# Patient Record
Sex: Female | Born: 1960 | Race: White | Hispanic: No | Marital: Married | Smoking: Never smoker
Health system: Southern US, Community
[De-identification: ages and names within clinical notes are randomized; demographics above are authoritative.]

## PROBLEM LIST (undated history)

## (undated) DIAGNOSIS — I10 Essential (primary) hypertension: Secondary | ICD-10-CM

## (undated) DIAGNOSIS — E785 Hyperlipidemia, unspecified: Secondary | ICD-10-CM

## (undated) DIAGNOSIS — J45909 Unspecified asthma, uncomplicated: Secondary | ICD-10-CM

## (undated) DIAGNOSIS — I2510 Atherosclerotic heart disease of native coronary artery without angina pectoris: Secondary | ICD-10-CM

## (undated) DIAGNOSIS — I214 Non-ST elevation (NSTEMI) myocardial infarction: Secondary | ICD-10-CM

## (undated) HISTORY — DX: Unspecified asthma, uncomplicated: J45.909

## (undated) HISTORY — DX: Hyperlipidemia, unspecified: E78.5

## (undated) HISTORY — DX: Atherosclerotic heart disease of native coronary artery without angina pectoris: I25.10

---
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
## 2005-09-03 ENCOUNTER — Emergency Department: Payer: Self-pay | Admitting: Unknown Physician Specialty

## 2006-09-27 ENCOUNTER — Ambulatory Visit: Payer: Self-pay | Admitting: Obstetrics and Gynecology

## 2006-10-04 ENCOUNTER — Ambulatory Visit: Payer: Self-pay | Admitting: Obstetrics and Gynecology

## 2007-05-08 ENCOUNTER — Ambulatory Visit: Payer: Self-pay | Admitting: General Surgery

## 2007-08-31 ENCOUNTER — Other Ambulatory Visit: Payer: Self-pay

## 2007-08-31 ENCOUNTER — Emergency Department: Payer: Self-pay | Admitting: Emergency Medicine

## 2007-11-04 ENCOUNTER — Ambulatory Visit: Payer: Self-pay | Admitting: General Surgery

## 2007-11-22 ENCOUNTER — Other Ambulatory Visit: Payer: Self-pay

## 2007-11-22 ENCOUNTER — Emergency Department: Payer: Medicare Other | Admitting: Emergency Medicine
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
## 2009-01-06 ENCOUNTER — Ambulatory Visit: Payer: Self-pay | Admitting: Obstetrics and Gynecology

## 2012-07-04 ENCOUNTER — Emergency Department: Payer: Self-pay | Admitting: Emergency Medicine

## 2012-11-10 ENCOUNTER — Emergency Department: Payer: Self-pay | Admitting: Emergency Medicine

## 2013-01-23 ENCOUNTER — Emergency Department: Payer: Self-pay | Admitting: Emergency Medicine
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
## 2017-06-17 DIAGNOSIS — G4733 Obstructive sleep apnea (adult) (pediatric): Secondary | ICD-10-CM | POA: Diagnosis present

## 2017-06-19 DIAGNOSIS — F419 Anxiety disorder, unspecified: Secondary | ICD-10-CM | POA: Insufficient documentation

## 2018-12-25 DIAGNOSIS — I214 Non-ST elevation (NSTEMI) myocardial infarction: Secondary | ICD-10-CM

## 2018-12-25 HISTORY — PX: CARDIAC CATHETERIZATION: SHX172

## 2018-12-25 HISTORY — DX: Non-ST elevation (NSTEMI) myocardial infarction: I21.4

## 2019-01-14 DIAGNOSIS — I252 Old myocardial infarction: Secondary | ICD-10-CM | POA: Insufficient documentation

## 2019-07-21 ENCOUNTER — Other Ambulatory Visit: Payer: Self-pay

## 2019-07-21 ENCOUNTER — Emergency Department
Admission: EM | Admit: 2019-07-21 | Discharge: 2019-07-21 | Disposition: A | Discharge status: Home / Self Care | Payer: Medicare Other | Attending: Emergency Medicine | Admitting: Emergency Medicine

## 2019-07-21 ENCOUNTER — Encounter: Payer: Self-pay | Admitting: *Deleted

## 2019-07-21 DIAGNOSIS — W540XXA Bitten by dog, initial encounter: Secondary | ICD-10-CM | POA: Diagnosis not present

## 2019-07-21 DIAGNOSIS — Y999 Unspecified external cause status: Secondary | ICD-10-CM | POA: Insufficient documentation

## 2019-07-21 DIAGNOSIS — I10 Essential (primary) hypertension: Secondary | ICD-10-CM | POA: Diagnosis not present

## 2019-07-21 DIAGNOSIS — S4991XA Unspecified injury of right shoulder and upper arm, initial encounter: Secondary | ICD-10-CM | POA: Diagnosis present

## 2019-07-21 DIAGNOSIS — Y929 Unspecified place or not applicable: Secondary | ICD-10-CM | POA: Insufficient documentation

## 2019-07-21 DIAGNOSIS — Y93K1 Activity, walking an animal: Secondary | ICD-10-CM | POA: Insufficient documentation

## 2019-07-21 DIAGNOSIS — S40811A Abrasion of right upper arm, initial encounter: Secondary | ICD-10-CM | POA: Diagnosis not present

## 2019-07-21 DIAGNOSIS — I252 Old myocardial infarction: Secondary | ICD-10-CM | POA: Insufficient documentation

## 2019-07-21 DIAGNOSIS — Z23 Encounter for immunization: Secondary | ICD-10-CM | POA: Insufficient documentation

## 2019-07-21 HISTORY — DX: Essential (primary) hypertension: I10

## 2019-07-21 MED ORDER — CYCLOBENZAPRINE HCL 5 MG PO TABS: 5.0000 mg | ORAL_TABLET | Freq: Three times a day (TID) | ORAL | 0 refills | Status: AC | PRN

## 2019-07-21 MED ORDER — TETANUS-DIPHTH-ACELL PERTUSSIS 5-2.5-18.5 LF-MCG/0.5 IM SUSP
0.5000 mL | Freq: Once | INTRAMUSCULAR | Status: AC
Start: 2019-07-21 — End: 2019-07-21
  Administered 2019-07-21: 0.5 mL via INTRAMUSCULAR
  Filled 2019-07-21: qty 0.5

## 2019-07-21 MED ORDER — CLINDAMYCIN HCL 150 MG PO CAPS
300.0000 mg | ORAL_CAPSULE | Freq: Once | ORAL | Status: AC
Start: 2019-07-21 — End: 2019-07-21
  Administered 2019-07-21: 22:00:00 300 mg via ORAL
  Filled 2019-07-21: qty 2

## 2019-07-21 MED ORDER — CLINDAMYCIN HCL 300 MG PO CAPS: 300.0000 mg | ORAL_CAPSULE | Freq: Three times a day (TID) | ORAL | 0 refills | Status: AC

## 2019-07-21 MED ORDER — DOXYCYCLINE HYCLATE 100 MG PO TABS: 100.0000 mg | ORAL_TABLET | Freq: Two times a day (BID) | ORAL | 0 refills | Status: DC

## 2019-07-21 MED ORDER — DOXYCYCLINE HYCLATE 100 MG PO TABS
100.0000 mg | ORAL_TABLET | Freq: Once | ORAL | Status: AC
  Administered 2019-07-21: 22:00:00 100 mg via ORAL
  Filled 2019-07-21: qty 1

## 2019-07-21 NOTE — ED Triage Notes (Signed)
Pt to ED after being bitten by a dog this afternoon on her right arm. Skin is not broken at this time but significant bruising noted. PT does not know if the dog was up to date on shots but has already reported the bite.

## 2019-07-21 NOTE — Discharge Instructions (Signed)
You are being treated for a dog bite (superficial) to the upper arm. Take the antibiotics as directed. Keep the area clean, dry, and covered. Follow-up with your provider for interim wound check.

## 2019-07-22 ENCOUNTER — Encounter: Payer: Self-pay | Admitting: Physician Assistant

## 2019-07-22 NOTE — ED Provider Notes (Signed)
[facility] Emergency Department Provider Note ____________________________________________  Time seen: 2115  I have reviewed the triage vital signs and the nursing notes.  HISTORY  Chief Complaint  Animal Bite  HPI Donna Prince is a 58 y.o. female presents to the ED for evaluation after a dog bite.  Patient describes she was walking her small puppy, when a large pitbull that she did not recognize that was not on the leash, began to attack her dog.  She states that when she noted that the larger dog was about to attack her puppies belly, she intervened.  She sustained a bite wound to the right upper arm musculature.  The patient noted immediate pain and deep muscle soreness to the region.  It was not until she got home home, that she finally evaluated her self, and found she had deep bruising to the upper arm and tricep muscle region.  There is superficial abrasion to the skin, but no indication of any deep puncture or bite wounds.  Patient presents now for further evaluation of injuries but she reports her tetanus is not up-to-date.  She does also note an allergy to penicillin drugs.  Patient is not aware of the larger dog's vaccine status, but the bite has been reported to local Production assistant, radioanimal control officers.  Past Medical History:  Diagnosis Date  . Hypertension   . MI, acute, non ST segment elevation (HCC) 2020    There are no active problems to display for this patient.   History reviewed. No pertinent surgical history.  Prior to Admission medications   Medication Sig Start Date End Date Taking? Authorizing Provider  clindamycin (CLEOCIN) 300 MG capsule Take 1 capsule (300 mg total) by mouth 3 (three) times daily for 10 days. 07/21/19 07/31/19  Kutter Schnepf, Charlesetta IvoryJenise V Bacon, PA-C  cyclobenzaprine (FLEXERIL) 5 MG tablet Take 1 tablet (5 mg total) by mouth 3 (three) times daily as needed for up to 3 days. 07/21/19 07/24/19  Kwynn Schlotter, Charlesetta IvoryJenise V Bacon, PA-C  doxycycline  (VIBRA-TABS) 100 MG tablet Take 1 tablet (100 mg total) by mouth 2 (two) times daily. 07/21/19   Ayla Dunigan, Charlesetta IvoryJenise V Bacon, PA-C    Allergies Penicillins  History reviewed. No pertinent family history.  Social History Social History   Tobacco Use  . Smoking status: Never Smoker  . Smokeless tobacco: Never Used  Substance Use Topics  . Alcohol use: Never    Frequency: Never  . Drug use: Never    Review of Systems  Constitutional: Negative for fever. Eyes: Negative for visual changes. ENT: Negative for sore throat. Cardiovascular: Negative for chest pain. Respiratory: Negative for shortness of breath. Gastrointestinal: Negative for abdominal pain, vomiting and diarrhea. Genitourinary: Negative for dysuria. Musculoskeletal: Negative for back pain.  Right upper arm muscle pain secondary to dog bite Skin: Negative for rash.  Neurological: Negative for headaches, focal weakness or numbness. ____________________________________________  PHYSICAL EXAM:  VITAL SIGNS: ED Triage Vitals  Enc Vitals Group     BP 07/21/19 1959 (!) 186/93     Pulse Rate 07/21/19 1959 85     Resp 07/21/19 1959 16     Temp 07/21/19 1959 98.2 F (36.8 C)     Temp Source 07/21/19 1959 Oral     SpO2 07/21/19 1959 99 %     Weight 07/21/19 2000 216 lb (98 kg)     Height 07/21/19 2000 5\' 6"  (1.676 m)     Head Circumference --      Peak Flow --  Pain Score 07/21/19 1959 5     Pain Loc --      Pain Edu? --      Excl. in [place]? --     Constitutional: Alert and oriented. Well appearing and in no distress. Head: Normocephalic and atraumatic. Eyes: Conjunctivae are normal. Normal extraocular movements Cardiovascular: Normal rate, regular rhythm. Normal distal pulses. Respiratory: Normal respiratory effort.  Musculoskeletal: Right arm without any obvious deformity or dislocation.  Patient is noted to have a large area of ecchymosis and bruising to the tricep muscle region.  The areas also shows a  superficial abrasion but no obvious laceration, puncture, or bite wounds.  Patient likely was spared by her overlying clothing.  Normal composite fist distally.  Nontender with normal range of motion in all extremities.  Neurologic: Cranial nerves II through XII grossly intact.  Normal gait without ataxia. Normal speech and language. No gross focal neurologic deficits are appreciated. Skin:  Skin is warm, dry and intact. No rash noted. Psychiatric: Mood and affect are normal. Patient exhibits appropriate insight and judgment. ____________________________________________  PROCEDURES  Procedures Tdap 0.5 ml IM Doxycycline 100 mg PO Clindamycin 300 mg PO ____________________________________________  INITIAL IMPRESSION / ASSESSMENT AND PLAN / ED COURSE  [name] was evaluated in Emergency Department on 07/22/2019 for the symptoms described in the history of present illness. She was evaluated in the context of the global COVID-19 pandemic, which necessitated consideration that the patient might be at risk for infection with the SARS-CoV-2 virus that causes COVID-19. Institutional protocols and algorithms that pertain to the evaluation of patients at risk for COVID-19 are in a state of rapid change based on information released by regulatory bodies including the CDC and federal and state organizations. These policies and algorithms were followed during the patient's care in the ED.  Patient with ED evaluation of injury sustained following a dog bite attack.  Patient sustained a bite to the right posterior arm over the tricep musculature.  There is superficial abrasion, but no deep tissue injury, laceration, or puncture wounds.  Patient is treated empirically with antibiotics as she has an overlying abrasion to the area which may have been caused by teeth contacting the skin.  Patient's tetanus was updated and she is encouraged to take the medications as prescribed.  She will follow-up with an  control related to vaccine/quarantine status of the dog.  Patient verbalizes understanding of her superficial wound care management instructions.  She will follow-up with her primary provider or return to the ED as discussed. ____________________________________________  FINAL CLINICAL IMPRESSION(S) / ED DIAGNOSES  Final diagnoses:  Dog bite, initial encounter      Melvenia Needles, PA-C 07/22/19 2337    Nance Pear, MD 07/23/19 [phone]

## 2022-10-20 ENCOUNTER — Inpatient Hospital Stay
Admission: EM | Admit: 2022-10-20 | Discharge: 2022-10-23 | DRG: 287 | Disposition: A | Discharge status: Home / Self Care | Payer: Self-pay | Attending: Internal Medicine | Admitting: Internal Medicine

## 2022-10-20 ENCOUNTER — Encounter: Payer: Self-pay | Admitting: Intensive Care

## 2022-10-20 ENCOUNTER — Other Ambulatory Visit: Payer: Self-pay

## 2022-10-20 ENCOUNTER — Emergency Department: Payer: 59

## 2022-10-20 DIAGNOSIS — R932 Abnormal findings on diagnostic imaging of liver and biliary tract: Secondary | ICD-10-CM

## 2022-10-20 DIAGNOSIS — Z888 Allergy status to other drugs, medicaments and biological substances status: Secondary | ICD-10-CM

## 2022-10-20 DIAGNOSIS — E0590 Thyrotoxicosis, unspecified without thyrotoxic crisis or storm: Secondary | ICD-10-CM | POA: Diagnosis present

## 2022-10-20 DIAGNOSIS — I2510 Atherosclerotic heart disease of native coronary artery without angina pectoris: Secondary | ICD-10-CM | POA: Diagnosis present

## 2022-10-20 DIAGNOSIS — G4733 Obstructive sleep apnea (adult) (pediatric): Secondary | ICD-10-CM | POA: Diagnosis present

## 2022-10-20 DIAGNOSIS — R079 Chest pain, unspecified: Secondary | ICD-10-CM

## 2022-10-20 DIAGNOSIS — I214 Non-ST elevation (NSTEMI) myocardial infarction: Secondary | ICD-10-CM

## 2022-10-20 DIAGNOSIS — F4310 Post-traumatic stress disorder, unspecified: Secondary | ICD-10-CM | POA: Diagnosis present

## 2022-10-20 DIAGNOSIS — I2489 Other forms of acute ischemic heart disease: Secondary | ICD-10-CM | POA: Diagnosis present

## 2022-10-20 DIAGNOSIS — Z91148 Patient's other noncompliance with medication regimen for other reason: Secondary | ICD-10-CM

## 2022-10-20 DIAGNOSIS — I161 Hypertensive emergency: Secondary | ICD-10-CM | POA: Diagnosis not present

## 2022-10-20 DIAGNOSIS — I252 Old myocardial infarction: Secondary | ICD-10-CM

## 2022-10-20 DIAGNOSIS — Z955 Presence of coronary angioplasty implant and graft: Secondary | ICD-10-CM

## 2022-10-20 DIAGNOSIS — R7989 Other specified abnormal findings of blood chemistry: Secondary | ICD-10-CM | POA: Diagnosis present

## 2022-10-20 DIAGNOSIS — E785 Hyperlipidemia, unspecified: Secondary | ICD-10-CM | POA: Diagnosis present

## 2022-10-20 DIAGNOSIS — Z6830 Body mass index (BMI) 30.0-30.9, adult: Secondary | ICD-10-CM

## 2022-10-20 DIAGNOSIS — E041 Nontoxic single thyroid nodule: Principal | ICD-10-CM

## 2022-10-20 DIAGNOSIS — Z880 Allergy status to penicillin: Secondary | ICD-10-CM

## 2022-10-20 DIAGNOSIS — E663 Overweight: Secondary | ICD-10-CM | POA: Diagnosis present

## 2022-10-20 DIAGNOSIS — Z79899 Other long term (current) drug therapy: Secondary | ICD-10-CM

## 2022-10-20 DIAGNOSIS — I110 Hypertensive heart disease with heart failure: Secondary | ICD-10-CM | POA: Diagnosis present

## 2022-10-20 DIAGNOSIS — I5032 Chronic diastolic (congestive) heart failure: Secondary | ICD-10-CM | POA: Diagnosis present

## 2022-10-20 DIAGNOSIS — Z91199 Patient's noncompliance with other medical treatment and regimen due to unspecified reason: Secondary | ICD-10-CM

## 2022-10-20 LAB — BASIC METABOLIC PANEL
Anion gap: 7 (ref 5–15)
BUN: 18 mg/dL (ref 6–20)
CO2: 25 mmol/L (ref 22–32)
Calcium: 9.2 mg/dL (ref 8.9–10.3)
Chloride: 108 mmol/L (ref 98–111)
Creatinine, Ser: 1.06 mg/dL — ABNORMAL HIGH (ref 0.44–1.00)
GFR, Estimated: >60 mL/min (ref >60)
Glucose, Bld: 163 mg/dL — ABNORMAL HIGH (ref 70–99)
Potassium: 3.9 mmol/L (ref 3.5–5.1)
Sodium: 140 mmol/L (ref 135–145)

## 2022-10-20 LAB — TROPONIN I (HIGH SENSITIVITY)
Troponin I (High Sensitivity): 31 ng/L — ABNORMAL HIGH (ref <18)
Troponin I (High Sensitivity): 574 ng/L (ref <18)

## 2022-10-20 LAB — CBC
HCT: 38.3 % (ref 36.0–46.0)
Hemoglobin: 12.9 g/dL (ref 12.0–15.0)
MCH: 26.7 pg (ref 26.0–34.0)
MCHC: 33.7 g/dL (ref 30.0–36.0)
MCV: 79.1 fL — ABNORMAL LOW (ref 80.0–100.0)
Platelets: 152 10*3/uL (ref 150–400)
RBC: 4.84 MIL/uL (ref 3.87–5.11)
RDW: 13.2 % (ref 11.5–15.5)
WBC: 3.6 10*3/uL — ABNORMAL LOW (ref 4.0–10.5)
nRBC: 0 % (ref 0.0–0.2)

## 2022-10-20 MED ORDER — MORPHINE SULFATE (PF) 2 MG/ML IV SOLN: 2.0000 mg | INTRAVENOUS | Status: DC | PRN

## 2022-10-20 MED ORDER — ATORVASTATIN CALCIUM 20 MG PO TABS
40.0000 mg | ORAL_TABLET | Freq: Every day | ORAL | Status: DC
  Administered 2022-10-21 – 2022-10-22 (×2): 40 mg via ORAL
  Filled 2022-10-20 (×2): qty 2

## 2022-10-20 MED ORDER — ONDANSETRON HCL 4 MG/2ML IJ SOLN: 4.0000 mg | Freq: Four times a day (QID) | INTRAMUSCULAR | Status: DC | PRN

## 2022-10-20 MED ORDER — NITROGLYCERIN 0.4 MG SL SUBL: 0.4000 mg | SUBLINGUAL_TABLET | SUBLINGUAL | Status: DC | PRN

## 2022-10-20 MED ORDER — LABETALOL HCL 5 MG/ML IV SOLN
10.0000 mg | Freq: Once | INTRAVENOUS | Status: AC
  Administered 2022-10-20: 10 mg via INTRAVENOUS
  Filled 2022-10-20: qty 4

## 2022-10-20 MED ORDER — METOPROLOL TARTRATE 25 MG PO TABS
12.5000 mg | ORAL_TABLET | Freq: Two times a day (BID) | ORAL | Status: DC
  Administered 2022-10-21 (×2): 12.5 mg via ORAL
  Filled 2022-10-20 (×2): qty 1

## 2022-10-20 MED ORDER — ACETAMINOPHEN 325 MG RE SUPP: 650.0000 mg | Freq: Four times a day (QID) | RECTAL | Status: DC | PRN

## 2022-10-20 MED ORDER — ACETAMINOPHEN 325 MG PO TABS: 650.0000 mg | ORAL_TABLET | Freq: Four times a day (QID) | ORAL | Status: DC | PRN

## 2022-10-20 MED ORDER — ACETAMINOPHEN 325 MG PO TABS
650.0000 mg | ORAL_TABLET | ORAL | Status: DC | PRN
  Administered 2022-10-22: 650 mg via ORAL
  Filled 2022-10-20: qty 2

## 2022-10-20 MED ORDER — ENOXAPARIN SODIUM 40 MG/0.4ML IJ SOSY
40.0000 mg | PREFILLED_SYRINGE | INTRAMUSCULAR | Status: DC
  Administered 2022-10-21: 40 mg via SUBCUTANEOUS
  Filled 2022-10-20: qty 0.4

## 2022-10-20 MED ORDER — AMLODIPINE BESYLATE 5 MG PO TABS
5.0000 mg | ORAL_TABLET | Freq: Every day | ORAL | Status: DC
  Administered 2022-10-21 – 2022-10-23 (×4): 5 mg via ORAL
  Filled 2022-10-20 (×4): qty 1

## 2022-10-20 MED ORDER — ALPRAZOLAM 0.25 MG PO TABS
0.2500 mg | ORAL_TABLET | Freq: Two times a day (BID) | ORAL | Status: DC | PRN
  Administered 2022-10-21 – 2022-10-23 (×2): 0.25 mg via ORAL
  Filled 2022-10-20 (×2): qty 1

## 2022-10-20 MED ORDER — ONDANSETRON HCL 4 MG PO TABS: 4.0000 mg | ORAL_TABLET | Freq: Four times a day (QID) | ORAL | Status: DC | PRN

## 2022-10-20 MED ORDER — ASPIRIN 81 MG PO TBEC
81.0000 mg | DELAYED_RELEASE_TABLET | Freq: Every day | ORAL | Status: DC
  Administered 2022-10-21 – 2022-10-23 (×3): 81 mg via ORAL
  Filled 2022-10-20 (×3): qty 1

## 2022-10-20 MED ORDER — ASPIRIN 81 MG PO CHEW
324.0000 mg | CHEWABLE_TABLET | Freq: Once | ORAL | Status: AC
  Administered 2022-10-20: 324 mg via ORAL
  Filled 2022-10-20: qty 4

## 2022-10-20 MED ORDER — HYDROCODONE-ACETAMINOPHEN 5-325 MG PO TABS
1.0000 | ORAL_TABLET | ORAL | Status: DC | PRN
  Administered 2022-10-21 (×2): 2 via ORAL
  Filled 2022-10-20 (×2): qty 2

## 2022-10-20 MED ORDER — IOHEXOL 350 MG/ML SOLN
100.0000 mL | Freq: Once | INTRAVENOUS | Status: AC | PRN
  Administered 2022-10-20: 100 mL via INTRAVENOUS

## 2022-10-20 MED ORDER — ALPRAZOLAM 0.5 MG PO TABS
0.2500 mg | ORAL_TABLET | Freq: Once | ORAL | Status: AC
  Administered 2022-10-20: 0.25 mg via ORAL
  Filled 2022-10-20: qty 1

## 2022-10-20 MED ORDER — HYDRALAZINE HCL 20 MG/ML IJ SOLN: 10.0000 mg | INTRAMUSCULAR | Status: DC | PRN

## 2022-10-20 NOTE — ED Triage Notes (Addendum)
Patient c/o left chest pressure with sob that started today. Patient appears anxious. Reports she is suppose to take blood thinners and hypertension medicine but quit about 6 months ago

## 2022-10-20 NOTE — H&P (Incomplete)
History and Physical    Patient: Donna Prince:[id number] DOB: 06-12-1961 DOA: 10/20/2022 DOS: the patient was seen and examined on 10/20/2022 PCP: Tanna Furry, MD  Patient coming from: Home  Chief Complaint:  Chief Complaint  Patient presents with   Chest Pain    HPI: Donna Prince is a 61 y.o. 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  She admits to not taking her meds in several months and has been lost to follow-up.  She denies vomiting, diaphoresis, palpitations or lightheadedness.  Denies lower extremity pain or swelling.  Denies fever or chills.  Patient took 2 aspirin at home and was chest pain-free by arrival. ED course and dataReview: On arrival BP 181/107 with pulse 107 and respirations 22 with O2 sat 97% on room air and afebrile.  First troponin 574 with labs otherwise unremarkable.  EKG, personally viewed and interpreted showing sinus tachycardia at 108 with nonspecific ST-T wave changes.  CTA chest shows no evidence of PE or other acute intrathoracic process. Patient treated with chewable aspirin and given a dose of labetalol 10 mg with improvement in blood pressure to 159/88 admission. The ED provider spoke with cardiologist Dr Azucena Cecil who advised to treat as demand ischemia unless troponin significantly uptrending.  Hospitalist consulted for admission.   Review of Systems: As mentioned in the history of present illness. All other systems reviewed and are negative.  Past Medical History:  Diagnosis Date   Hypertension    MI, acute, non ST segment elevation (HCC) 2020   History reviewed. No pertinent surgical history. Social History:  reports that she has never smoked. She has never used smokeless tobacco. She  reports that she does not drink alcohol and does not use drugs.  Allergies  Allergen Reactions   Penicillins Hives    History reviewed. No pertinent family history.  Prior to Admission medications   Medication Sig Start Date End Date Taking? Authorizing Provider  doxycycline (VIBRA-TABS) 100 MG tablet Take 1 tablet (100 mg total) by mouth 2 (two) times daily. 07/21/19   Menshew, Charlesetta Ivory, PA-C    Physical Exam: Vitals:   10/20/22 2025 10/20/22 2040 10/20/22 2100 10/20/22 2117  BP: (!) 193/110 (!) 204/101 (!) 170/86 (!) 159/88  Pulse: 99 99 90 87  Resp:  18 20 (!) 21  Temp:      TempSrc:      SpO2: 98% 97% 98% 98%  Weight:      Height:       Physical Exam  Labs on Admission: I have personally reviewed following labs and imaging studies  CBC: Recent Labs  Lab 10/20/22 1807  WBC 3.6*  HGB 12.9  HCT 38.3  MCV 79.1*  PLT 152   Basic Metabolic Panel: Recent Labs  Lab 10/20/22 1807  NA 140  K 3.9  CL 108  CO2 25  GLUCOSE 163*  BUN 18  CREATININE 1.06*  CALCIUM 9.2   GFR: Estimated Creatinine Clearance: 61.5 mL/min (A) (by C-G formula based on SCr of 1.06 mg/dL (H)). Liver Function Tests: No results for input(s): "AST", "ALT", "ALKPHOS", "BILITOT", "PROT", "ALBUMIN" in the last 168 hours. No results for input(s): "LIPASE", "AMYLASE" in the last 168 hours. No results for input(s): "AMMONIA" in the  last 168 hours. Coagulation Profile: No results for input(s): "INR", "PROTIME" in the last 168 hours. Cardiac Enzymes: No results for input(s): "CKTOTAL", "CKMB", "CKMBINDEX", "TROPONINI" in the last 168 hours. BNP (last 3 results) No results for input(s): "PROBNP" in the last 8760 hours. HbA1C: No results for input(s): "HGBA1C" in the last 72 hours. CBG: No results for input(s): "GLUCAP" in the last 168 hours. Lipid Profile: No results for input(s): "CHOL", "HDL", "LDLCALC", "TRIG", "CHOLHDL", "LDLDIRECT" in the last 72 hours. Thyroid Function Tests: No  results for input(s): "TSH", "T4TOTAL", "FREET4", "T3FREE", "THYROIDAB" in the last 72 hours. Anemia Panel: No results for input(s): "VITAMINB12", "FOLATE", "FERRITIN", "TIBC", "IRON", "RETICCTPCT" in the last 72 hours. Urine analysis: No results found for: "COLORURINE", "APPEARANCEUR", "LABSPEC", "PHURINE", "GLUCOSEU", "HGBUR", "BILIRUBINUR", "KETONESUR", "PROTEINUR", "UROBILINOGEN", "NITRITE", "LEUKOCYTESUR"  Radiological Exams on Admission: CT Angio Chest Aorta W and/or Wo Contrast  Result Date: 10/20/2022 CLINICAL DATA:  Left chest pressure and shortness of breath. EXAM: CT ANGIOGRAPHY CHEST WITH CONTRAST TECHNIQUE: Multidetector CT imaging of the chest was performed using the standard protocol during bolus administration of intravenous contrast. Multiplanar CT image reconstructions and MIPs were obtained to evaluate the vascular anatomy. RADIATION DOSE REDUCTION: This exam was performed according to the departmental dose-optimization program which includes automated exposure control, adjustment of the mA and/or kV according to patient size and/or use of iterative reconstruction technique. CONTRAST:  OMNIPAQUE IOHEXOL 350 MG/ML SOLN COMPARISON:  November 14, 2007 FINDINGS: Cardiovascular: Satisfactory opacification of the pulmonary arteries to the segmental level. No evidence of pulmonary embolism. Normal heart size. No pericardial effusion. Mediastinum/Nodes: No enlarged mediastinal, hilar, or axillary lymph nodes. Small bilateral thyroid nodules are noted. The trachea and esophagus demonstrate no significant findings. Lungs/Pleura: Lungs are clear. No pleural effusion or pneumothorax. Upper Abdomen: A 13 mm diameter round hypodense lesion is seen within the anterior aspect of the right lobe of the liver. Additional subcentimeter foci of low attenuation are noted within the left lobe of the thyroid gland. Musculoskeletal: No chest wall abnormality. No acute or significant osseous findings.  Review of the MIP images confirms the above findings. IMPRESSION: 1. No evidence of pulmonary embolism or other acute intrathoracic process. 2. Small bilateral thyroid nodules. No follow-up imaging is recommended. This follows ACR consensus guidelines: Managing Incidental Thyroid Nodules Detected on Imaging: White Paper of the ACR Incidental Thyroid Findings Committee. J Am Coll Radiol 2015; 12:143-150. 3. Findings which may represent small hepatic cysts and/or hemangiomas. Correlation with nonemergent hepatic ultrasound is recommended. Electronically Signed   By: Aram Candela M.D.   On: 10/20/2022 21:06   DG Chest 2 View  Result Date: 10/20/2022 CLINICAL DATA:  Left chest pressure and shortness of breath starting today EXAM: CHEST - 2 VIEW COMPARISON:  11/11/2012 FINDINGS: The heart size and mediastinal contours are within normal limits. Both lungs are clear. The visualized skeletal structures are unremarkable. IMPRESSION: No active cardiopulmonary disease. Electronically Signed   By: Gaylyn Rong M.D.   On: 10/20/2022 18:29     Data Reviewed: Relevant notes from primary care and specialist visits, past discharge summaries as available in EHR, including Care Everywhere. Prior diagnostic testing as pertinent to current admission diagnoses Updated medications and problem lists for reconciliation ED course, including vitals, labs, imaging, treatment and response to treatment Triage notes, nursing and pharmacy notes and ED provider's notes Notable results as noted in HPI   Assessment and Plan: * Hypertensive emergency Patient noncompliant with BP meds with BP 181/107 on arrival and symptomatic  for chest pain and shortness of breath Improved with labetalol 10 mg x 1 dose in the ED Restart prior antihypertensives  Lopressor 12.5 mg twice daily.   Will hold prior losartan as patient reports possible mouth swelling(will update allergy list) Amlodipine 5mg  daily TOC consult to explore  barriers to medication compliance    NSTEMI (non-ST elevated myocardial infarction) ([place]) CAD with history of PCI 2020 and 2021 for NSTEMI Patient presents with chest pain and shortness of breath.  First troponin 574 but EKG nonacute Continue to trend troponins Suspecting demand ischemia from hypertensive emergency .The ED provider spoke with cardiologist Dr Garen Lah who advised to treat as demand ischemia unless significant delta with trend Continue atorvastatin 80 mg daily, aspirin 81 mg daily, nitroglycerin sublingual as needed chest pain Will get echo Cardiology consult to follow    Noncompliance, with medication, CPAP and follow-up Counseling provided regarding risk to overall prognosis TOC consult  Obstructive sleep apnea Not currently on CPAP    DVT prophylaxis: Lovenox  Consults: [facility] cardiology, Dr. Garen Lah  Advance Care Planning: full code  Family Communication: none  Disposition Plan: Back to previous home environment  Severity of Illness: The appropriate patient status for this patient is OBSERVATION. Observation status is judged to be reasonable and necessary in order to provide the required intensity of service to ensure the patient's safety. The patient's presenting symptoms, physical exam findings, and initial radiographic and laboratory data in the context of their medical condition is felt to place them at decreased risk for further clinical deterioration. Furthermore, it is anticipated that the patient will be medically stable for discharge from the hospital within 2 midnights of admission.   Author: Athena Masse, MD 10/20/2022 9:57 PM  For on call review [email or web address].

## 2022-10-20 NOTE — ED Provider Notes (Signed)
[facility] Provider Note    Event Date/Time   First MD Initiated Contact with Patient 10/20/22 2021     (approximate)   History   Chest Pain   HPI  Donna Prince is a 61 y.o. female reports a history of coronary artery disease with previous stenting at [facility], hypertension  Patient reports that today while shopping she suddenly felt a sharp discomfort and a burning feeling in her left upper chest.  It was fairly severe and persisted for a while but has since gone away after she arrived to the ED waiting room.  Her symptoms have now all gone away completely.  There was no shortness of breath.  No leg swelling.  She reports that the pain was sort of sharp rather abrupt in onset located in the left upper chest and had a burning component also across her chest with it.  It is since gone away.  She took 2 baby aspirin at home  She felt anxious when it first started felt as though she was having a bout to have a bout of "PTSD" or some type of panic attack but that symptom has improved now.  She used to take blood pressure medicine but stopped taking it, she reports one of the medicines possibly the 1 that was 100 mg at [facility] was giving her caused a bit of a swelling feeling in her throat at times but she is not really sure.  She has self discontinued it  No ongoing symptoms.  Denies any risk of pregnancy.  No headache.  No abdominal pain     Physical Exam   Triage Vital Signs: ED Triage Vitals  Enc Vitals Group     BP 10/20/22 1807 (!) 181/107     Pulse Rate 10/20/22 1807 (!) 107     Resp 10/20/22 1807 (!) 22     Temp 10/20/22 1807 98.1 F (36.7 C)     Temp Source 10/20/22 1807 Oral     SpO2 10/20/22 1807 97 %     Weight 10/20/22 1804 180 lb (81.6 kg)     Height 10/20/22 1804 5\' 6"  (1.676 m)     Head Circumference --      Peak Flow --      Pain Score 10/20/22 1804 5     Pain Loc --      Pain Edu? --      Excl. in GC? --     Most recent vital  signs: Vitals:   10/20/22 2230 10/20/22 2245  BP: (!) 150/83 (!) 153/74  Pulse: 90 80  Resp: 17 12  Temp:    SpO2: 98% 97%     General: Awake, no distress.  Appears slightly anxious. CV:  Good peripheral perfusion.  Normal tones Resp:  Normal effort.  Clear bilateral Abd:  No distention.  Soft nontender Other:  No lower extremity edema venous cords or congestion   ED Results / Procedures / Treatments   Labs (all labs ordered are listed, but only abnormal results are displayed) Labs Reviewed  BASIC METABOLIC PANEL - Abnormal; Notable for the following components:      Result Value   Glucose, Bld 163 (*)    Creatinine, Ser 1.06 (*)    All other components within normal limits  CBC - Abnormal; Notable for the following components:   WBC 3.6 (*)    MCV 79.1 (*)    All other components within normal limits  TROPONIN I (HIGH  SENSITIVITY) - Abnormal; Notable for the following components:   Troponin I (High Sensitivity) 31 (*)    All other components within normal limits  TROPONIN I (HIGH SENSITIVITY) - Abnormal; Notable for the following components:   Troponin I (High Sensitivity) 574 (*)    All other components within normal limits  HIV ANTIBODY (ROUTINE TESTING W REFLEX)  LIPOPROTEIN A (LPA)  CBC  CREATININE, SERUM     EKG  Interpreted by me at 1810 heart rate 110 QRS 80 QTc 460 Sinus tachycardia.  No evidence of acute ischemia   RADIOLOGY  CT Angio Chest Aorta W and/or Wo Contrast  Result Date: 10/20/2022 CLINICAL DATA:  Left chest pressure and shortness of breath. EXAM: CT ANGIOGRAPHY CHEST WITH CONTRAST TECHNIQUE: Multidetector CT imaging of the chest was performed using the standard protocol during bolus administration of intravenous contrast. Multiplanar CT image reconstructions and MIPs were obtained to evaluate the vascular anatomy. RADIATION DOSE REDUCTION: This exam was performed according to the departmental dose-optimization program which includes  automated exposure control, adjustment of the mA and/or kV according to patient size and/or use of iterative reconstruction technique. CONTRAST:  OMNIPAQUE IOHEXOL 350 MG/ML SOLN COMPARISON:  November 14, 2007 FINDINGS: Cardiovascular: Satisfactory opacification of the pulmonary arteries to the segmental level. No evidence of pulmonary embolism. Normal heart size. No pericardial effusion. Mediastinum/Nodes: No enlarged mediastinal, hilar, or axillary lymph nodes. Small bilateral thyroid nodules are noted. The trachea and esophagus demonstrate no significant findings. Lungs/Pleura: Lungs are clear. No pleural effusion or pneumothorax. Upper Abdomen: A 13 mm diameter round hypodense lesion is seen within the anterior aspect of the right lobe of the liver. Additional subcentimeter foci of low attenuation are noted within the left lobe of the thyroid gland. Musculoskeletal: No chest wall abnormality. No acute or significant osseous findings. Review of the MIP images confirms the above findings. IMPRESSION: 1. No evidence of pulmonary embolism or other acute intrathoracic process. 2. Small bilateral thyroid nodules. No follow-up imaging is recommended. This follows ACR consensus guidelines: Managing Incidental Thyroid Nodules Detected on Imaging: White Paper of the ACR Incidental Thyroid Findings Committee. J Am Coll Radiol 2015; 12:143-150. 3. Findings which may represent small hepatic cysts and/or hemangiomas. Correlation with nonemergent hepatic ultrasound is recommended. Electronically Signed   By: Aram Candela M.D.   On: 10/20/2022 21:06   DG Chest 2 View  Result Date: 10/20/2022 CLINICAL DATA:  Left chest pressure and shortness of breath starting today EXAM: CHEST - 2 VIEW COMPARISON:  11/11/2012 FINDINGS: The heart size and mediastinal contours are within normal limits. Both lungs are clear. The visualized skeletal structures are unremarkable. IMPRESSION: No active cardiopulmonary disease.  Electronically Signed   By: Gaylyn Rong M.D.   On: 10/20/2022 18:29       PROCEDURES:  Critical Care performed: Yes, see critical care procedure note(s)  CRITICAL CARE Performed by: Sharyn Creamer   Total critical care time: 30 minutes  Critical care time was exclusive of separately billable procedures and treating other patients.  Critical care was necessary to treat or prevent imminent or life-threatening deterioration.  Critical care was time spent personally by me on the following activities: development of treatment plan with patient and/or surrogate as well as nursing, discussions with consultants, evaluation of patient's response to treatment, examination of patient, obtaining history from patient or surrogate, ordering and performing treatments and interventions, ordering and review of laboratory studies, ordering and review of radiographic studies, pulse oximetry and re-evaluation of patient's condition.  Patient presenting with symptoms of potential ACS, also severely elevated blood pressure concern for hypertensive emergency.  Procedures   MEDICATIONS ORDERED IN ED: Medications  aspirin EC tablet 81 mg (has no administration in time range)  nitroGLYCERIN (NITROSTAT) SL tablet 0.4 mg (has no administration in time range)  acetaminophen (TYLENOL) tablet 650 mg (has no administration in time range)  ondansetron (ZOFRAN) injection 4 mg (has no administration in time range)  enoxaparin (LOVENOX) injection 40 mg (has no administration in time range)  metoprolol tartrate (LOPRESSOR) tablet 12.5 mg (has no administration in time range)  atorvastatin (LIPITOR) tablet 40 mg (has no administration in time range)  ALPRAZolam (XANAX) tablet 0.25 mg (has no administration in time range)  HYDROcodone-acetaminophen (NORCO/VICODIN) 5-325 MG per tablet 1-2 tablet (has no administration in time range)  morphine (PF) 2 MG/ML injection 2 mg (has no administration in time range)   ondansetron (ZOFRAN) tablet 4 mg (has no administration in time range)  hydrALAZINE (APRESOLINE) injection 10 mg (has no administration in time range)  amLODipine (NORVASC) tablet 5 mg (has no administration in time range)  aspirin chewable tablet 324 mg (324 mg Oral Given 10/20/22 2039)  labetalol (NORMODYNE) injection 10 mg (10 mg Intravenous Given 10/20/22 2038)  ALPRAZolam (XANAX) tablet 0.25 mg (0.25 mg Oral Given 10/20/22 2039)  iohexol (OMNIPAQUE) 350 MG/ML injection 100 mL (100 mLs Intravenous Contrast Given 10/20/22 2048)     IMPRESSION / MDM / ASSESSMENT AND PLAN / ED COURSE  I reviewed the triage vital signs and the nursing notes.                              Differential diagnosis includes, but is not limited to, ACS, aortic dissection, pulmonary embolism, cardiac tamponade, pneumothorax, pneumonia, pericarditis, myocarditis, GI-related causes including esophagitis/gastritis, and musculoskeletal chest wall pain, pretense of emergency etc.  Based on the abrupt presentation with a sharp pain then a burning component in the left side of chest and a very mildly elevated initial troponin but now asymptomatic plan to further work-up by providing IV antihypertensive, also provide small dose of Xanax which patient reports she has taken in the past for anxiety, as well as obtain CT angio to rule out dissection.  The patient at this point with her initially elevated troponin appears to be at elevated risk for ACS and will clearly require admission and further cardiac work-up.  Patient is understanding agreeable with this.  Thankfully she is asymptomatic at this time.    Patient's presentation is most consistent with acute presentation with potential threat to life or bodily function.  The patient is on the cardiac monitor to evaluate for evidence of arrhythmia and/or significant heart rate changes.  Clinical Course as of 10/20/22 2357  Fri Oct 20, 2022  2155 Case and clinical history  discussed with Dr. Mylo Red.  Cardiology to consult  [MQ]    Clinical Course User Index [MQ] Delman Kitten, MD   Patient admitted to the hospitalist after consulting with Dr. Damita Dunnings.  I also discussed case with her cardiologist, he is reviewed and is advising that reasonable to treat as hypertensive emergency with demand ischemia at this time, but if troponin continues to have significant elevations in serial checks would also consider NSTEMI and at that point would indicate need for anticoagulation.  Currently patient asymptomatic understand agreeable with plan for admission.   FINAL CLINICAL IMPRESSION(S) / ED DIAGNOSES   Final diagnoses:  Thyroid nodule  Abnormal CT of liver  Hypertensive emergency without congestive heart failure  Chest pain with high risk for cardiac etiology     Rx / DC Orders   ED Discharge Orders     None        Note:  This document was prepared using Dragon voice recognition software and may include unintentional dictation errors.   Sharyn Creamer, MD 10/20/22 [phone]

## 2022-10-20 NOTE — Assessment & Plan Note (Signed)
Counseling provided regarding risk to overall prognosis TOC consult

## 2022-10-20 NOTE — Assessment & Plan Note (Signed)
Not currently on CPAP.  Recommendation for follow-up

## 2022-10-20 NOTE — ED Notes (Signed)
Pt ambulatory to rm at this time. Pt has urge to urinate. Pt ambulatory to bathroom down the hallway. Dessie, RN aware that pt is in bathroom.

## 2022-10-20 NOTE — Assessment & Plan Note (Addendum)
Patient noncompliant with BP meds with BP 181/107 on arrival  Improved with labetalol 10 mg x 1 dose in the ED.  Patient placed on regimen of Norvasc, Coreg, and hydralazine and given prescriptions for

## 2022-10-20 NOTE — Assessment & Plan Note (Addendum)
CAD with history of PCI 2020 and 2021 for NSTEMI.  Troponin maxed around 2100.  Has been trending downward.   Continue aspirin, nitroglycerin and statin.  Possible this could be demand ischemia.   Left heart catheterization planned for 10/30 by [facility] cardiology

## 2022-10-21 ENCOUNTER — Other Ambulatory Visit: Payer: Self-pay

## 2022-10-21 ENCOUNTER — Inpatient Hospital Stay
Admit: 2022-10-21 | Discharge: 2022-10-21 | Disposition: A | Discharge status: Home / Self Care | Payer: 59 | Attending: Internal Medicine | Admitting: Internal Medicine

## 2022-10-21 ENCOUNTER — Encounter: Payer: Self-pay | Admitting: Internal Medicine

## 2022-10-21 DIAGNOSIS — I110 Hypertensive heart disease with heart failure: Secondary | ICD-10-CM | POA: Diagnosis present

## 2022-10-21 DIAGNOSIS — E785 Hyperlipidemia, unspecified: Secondary | ICD-10-CM | POA: Diagnosis not present

## 2022-10-21 DIAGNOSIS — I10 Essential (primary) hypertension: Secondary | ICD-10-CM | POA: Diagnosis not present

## 2022-10-21 DIAGNOSIS — G4733 Obstructive sleep apnea (adult) (pediatric): Secondary | ICD-10-CM | POA: Diagnosis present

## 2022-10-21 DIAGNOSIS — I161 Hypertensive emergency: Secondary | ICD-10-CM | POA: Diagnosis present

## 2022-10-21 DIAGNOSIS — E663 Overweight: Secondary | ICD-10-CM | POA: Diagnosis present

## 2022-10-21 DIAGNOSIS — Z91148 Patient's other noncompliance with medication regimen for other reason: Secondary | ICD-10-CM | POA: Diagnosis not present

## 2022-10-21 DIAGNOSIS — I5032 Chronic diastolic (congestive) heart failure: Secondary | ICD-10-CM | POA: Diagnosis not present

## 2022-10-21 DIAGNOSIS — Z79899 Other long term (current) drug therapy: Secondary | ICD-10-CM | POA: Diagnosis not present

## 2022-10-21 DIAGNOSIS — E0590 Thyrotoxicosis, unspecified without thyrotoxic crisis or storm: Secondary | ICD-10-CM | POA: Diagnosis present

## 2022-10-21 DIAGNOSIS — R7989 Other specified abnormal findings of blood chemistry: Secondary | ICD-10-CM | POA: Diagnosis not present

## 2022-10-21 DIAGNOSIS — I252 Old myocardial infarction: Secondary | ICD-10-CM | POA: Diagnosis not present

## 2022-10-21 DIAGNOSIS — Z91199 Patient's noncompliance with other medical treatment and regimen due to unspecified reason: Secondary | ICD-10-CM | POA: Diagnosis not present

## 2022-10-21 DIAGNOSIS — R778 Other specified abnormalities of plasma proteins: Secondary | ICD-10-CM | POA: Diagnosis not present

## 2022-10-21 DIAGNOSIS — R079 Chest pain, unspecified: Secondary | ICD-10-CM | POA: Diagnosis present

## 2022-10-21 DIAGNOSIS — I169 Hypertensive crisis, unspecified: Secondary | ICD-10-CM

## 2022-10-21 DIAGNOSIS — Z6830 Body mass index (BMI) 30.0-30.9, adult: Secondary | ICD-10-CM | POA: Diagnosis not present

## 2022-10-21 DIAGNOSIS — I2510 Atherosclerotic heart disease of native coronary artery without angina pectoris: Secondary | ICD-10-CM | POA: Diagnosis present

## 2022-10-21 DIAGNOSIS — Z880 Allergy status to penicillin: Secondary | ICD-10-CM | POA: Diagnosis not present

## 2022-10-21 DIAGNOSIS — Z888 Allergy status to other drugs, medicaments and biological substances status: Secondary | ICD-10-CM | POA: Diagnosis not present

## 2022-10-21 DIAGNOSIS — Z955 Presence of coronary angioplasty implant and graft: Secondary | ICD-10-CM | POA: Diagnosis not present

## 2022-10-21 DIAGNOSIS — F4310 Post-traumatic stress disorder, unspecified: Secondary | ICD-10-CM | POA: Diagnosis present

## 2022-10-21 DIAGNOSIS — I2489 Other forms of acute ischemic heart disease: Secondary | ICD-10-CM | POA: Diagnosis present

## 2022-10-21 DIAGNOSIS — I214 Non-ST elevation (NSTEMI) myocardial infarction: Secondary | ICD-10-CM

## 2022-10-21 LAB — ECHOCARDIOGRAM COMPLETE
AR max vel: 3.07 cm2
AV Area VTI: 2.65 cm2
AV Area mean vel: 2.7 cm2
AV Mean grad: 5 mmHg
AV Peak grad: 9 mmHg
Ao pk vel: 1.5 m/s
Area-P 1/2: 2.81 cm2
Height: 66 in
MV VTI: 2.83 cm2
S' Lateral: 3.1 cm
Weight: 2880 oz

## 2022-10-21 LAB — HEMOGLOBIN A1C
Hgb A1c MFr Bld: 4.8 % (ref 4.8–5.6)
Mean Plasma Glucose: 91.06 mg/dL

## 2022-10-21 LAB — TSH: TSH: 0.034 u[IU]/mL — ABNORMAL LOW (ref 0.350–4.500)

## 2022-10-21 LAB — CBC
HCT: 37.1 % (ref 36.0–46.0)
Hemoglobin: 12.5 g/dL (ref 12.0–15.0)
MCH: 26.3 pg (ref 26.0–34.0)
MCHC: 33.7 g/dL (ref 30.0–36.0)
MCV: 77.9 fL — ABNORMAL LOW (ref 80.0–100.0)
Platelets: 158 10*3/uL (ref 150–400)
RBC: 4.76 MIL/uL (ref 3.87–5.11)
RDW: 13.2 % (ref 11.5–15.5)
WBC: 4.9 10*3/uL (ref 4.0–10.5)
nRBC: 0 % (ref 0.0–0.2)

## 2022-10-21 LAB — HIV ANTIBODY (ROUTINE TESTING W REFLEX): HIV Screen 4th Generation wRfx: NONREACTIVE

## 2022-10-21 LAB — PROTIME-INR
INR: 1.1 (ref 0.8–1.2)
Prothrombin Time: 13.8 seconds (ref 11.4–15.2)

## 2022-10-21 LAB — TROPONIN I (HIGH SENSITIVITY)
Troponin I (High Sensitivity): 2154 ng/L (ref <18)
Troponin I (High Sensitivity): 1665 ng/L (ref <18)

## 2022-10-21 LAB — APTT: aPTT: 30 seconds (ref 24–36)

## 2022-10-21 LAB — BRAIN NATRIURETIC PEPTIDE: B Natriuretic Peptide: 200 pg/mL — ABNORMAL HIGH (ref 0.0–100.0)

## 2022-10-21 LAB — T4, FREE: Free T4: 1.17 ng/dL — ABNORMAL HIGH (ref 0.61–1.12)

## 2022-10-21 LAB — HEPARIN LEVEL (UNFRACTIONATED)
Heparin Unfractionated: 0.38 IU/mL (ref 0.30–0.70)
Heparin Unfractionated: 0.3 IU/mL (ref 0.30–0.70)

## 2022-10-21 MED ORDER — HEPARIN BOLUS VIA INFUSION
4000.0000 [IU] | Freq: Once | INTRAVENOUS | Status: AC
  Administered 2022-10-21: 4000 [IU] via INTRAVENOUS
  Filled 2022-10-21: qty 4000

## 2022-10-21 MED ORDER — ORAL CARE MOUTH RINSE: 15.0000 mL | OROMUCOSAL | Status: DC | PRN

## 2022-10-21 MED ORDER — HEPARIN (PORCINE) 25000 UT/250ML-% IV SOLN
1200.0000 [IU]/h | INTRAVENOUS | Status: DC
  Administered 2022-10-21 – 2022-10-22 (×2): 950 [IU]/h via INTRAVENOUS
  Administered 2022-10-22: 1200 [IU]/h via INTRAVENOUS
  Filled 2022-10-21 (×3): qty 250

## 2022-10-21 MED ORDER — CARVEDILOL 25 MG PO TABS
25.0000 mg | ORAL_TABLET | Freq: Two times a day (BID) | ORAL | Status: DC
  Administered 2022-10-21 – 2022-10-23 (×5): 25 mg via ORAL
  Filled 2022-10-21 (×5): qty 1

## 2022-10-21 MED ORDER — HYDRALAZINE HCL 50 MG PO TABS
50.0000 mg | ORAL_TABLET | Freq: Three times a day (TID) | ORAL | Status: DC
  Administered 2022-10-21 – 2022-10-23 (×6): 50 mg via ORAL
  Filled 2022-10-21 (×6): qty 1

## 2022-10-21 NOTE — Progress Notes (Signed)
ANTICOAGULATION CONSULT NOTE   Pharmacy Consult for IV heparin Indication: chest pain/ACS  Allergies  Allergen Reactions   Lisinopril Swelling   Losartan Swelling   Mangifera Indica    Amoxicillin Hives, Diarrhea and Rash   Poison Oak Extract    Penicillins Hives and Rash    Patient Measurements: Height: 5\' 6"  (167.6 cm) Weight: 81.6 kg (180 lb) IBW/kg (Calculated) : 59.3 Heparin Dosing Weight: 76.4 kg  Vital Signs: Temp: 97.7 F (36.5 C) (10/28 0849) BP: 162/106 (10/28 0849) Pulse Rate: 79 (10/28 0849)  Labs: Recent Labs    10/20/22 1807 10/20/22 2020 10/21/22 0257 10/21/22 0507 10/21/22 1107  HGB 12.9  --   --   --  12.5  HCT 38.3  --   --   --  37.1  PLT 152  --   --   --  158  APTT  --   --   --  30  --   LABPROT  --   --   --  13.8  --   INR  --   --   --  1.1  --   HEPARINUNFRC  --   --   --   --  0.38  CREATININE 1.06*  --   --   --   --   TROPONINIHS 31* 574* 2,154* 1,665*  --      Estimated Creatinine Clearance: 61.5 mL/min (A) (by C-G formula based on SCr of 1.06 mg/dL (H)).   Medical History: Past Medical History:  Diagnosis Date   Hypertension    MI, acute, non ST segment elevation ([place]) 2020    Medications:  Medications Prior to Admission  Medication Sig Dispense Refill Last Dose   acetaminophen (TYLENOL) 325 MG tablet Take 650 mg by mouth every 6 (six) hours as needed for headache or mild pain.   prn at prn   ibuprofen (ADVIL) 200 MG tablet Take 400 mg by mouth every 6 (six) hours as needed for moderate pain.   prn at prn   doxycycline (VIBRA-TABS) 100 MG tablet Take 1 tablet (100 mg total) by mouth 2 (two) times daily. (Patient not taking: Reported on 10/20/2022) 20 tablet 0 Completed Course   Scheduled:   amLODipine  5 mg Oral Daily   aspirin EC  81 mg Oral Daily   atorvastatin  40 mg Oral Daily   carvedilol  25 mg Oral BID WC   hydrALAZINE  50 mg Oral Q8H   Infusions:   heparin 950 Units/hr (10/21/22 0510)   PRN:  acetaminophen, ALPRAZolam, hydrALAZINE, HYDROcodone-acetaminophen, morphine injection, nitroGLYCERIN, ondansetron (ZOFRAN) IV, ondansetron **OR** [DISCONTINUED] ondansetron (ZOFRAN) IV Anti-infectives (From admission, onward)    None      Not on chronic PTA anticoagulation per my chart review  Assessment: 61 year old female presenting with chest pain and shortness of breath. Troponins elevated. Pharmacy has been consulted to start heparin for ACS.  10/28 11:07 HL 0.38.    Goal of Therapy:  Heparin level 0.3-0.7 units/ml Monitor platelets by anticoagulation protocol: Yes   Plan:  Heparin level is therapeutic. Will continue heparin infusion at 950 units/hr. Recheck heparin level in 6 hours. CBC daily while on heparin. Plan for [facility] Monday.    Oswald Hillock, PharmD, BCPS 10/21/2022,11:58 AM

## 2022-10-21 NOTE — Consult Note (Signed)
Cardiology Consultation   Patient ID: Donna Prince MRN: [id number]; DOB: February 16, 1961  Admit date: 10/20/2022 Date of Consult: 10/21/2022  PCP:  Alfonse Flavors, [place] Providers Cardiologist:  [facility]  Patient Profile:   Donna Prince is a 61 y.o. female with a hx of CAD status post DES OM 2 in 2020, OSA, hypertension, hyperlipidemia who is being seen 10/21/2022 for the evaluation of chest pain at the request of Dr. [place] Pink.  History of Present Illness:   Ms. Donna Prince is followed by [facility] cardiology.  In January 2020 the patient presented with chest pain, found to have STEMI.  Cath showed 99% mid OM 2 lesion treated with DES.  Echo showed normal LV function.  She has allergy to lisinopril with angioedema.  She tolerates losartan.  She was started on DAPT with aspirin and prasugrel.  In 06/13/2020 the patient presented with acute chest pressure and dyspnea in the setting of being off her DAPT for 2 weeks.  She was admitted for non-STEMI/scad.  PCP has switched prasugrel to Plavix.  Unfortunately, she had stopped her prasugrel and not taking her Plavix.  Troponin peaked at 9.8.  She underwent left heart cath on 628 that showed very tortuous coronary arteries, ostial 70% stenosis of small first diagonal, TIMI-3 flow in the previously stented marginal,, concerns for possible scad of a small ramus branch.  Medical management was recommended. Echo showed LVEF>55%. She was discharged on aspirin, Plavix and home metoprolol.  The patient presented to the [facility] ED 10/20/2022 for chest pain.  The patient was leaving the store when she felt that sharp discomfort and burning in her left upper chest. It lasted for a couple hours. No associated symptoms. She never felt this type of pain before. She denies leg swelling, orthopnea, PND.  She took 2 baby aspirin at home with no change in symptoms. Initially, she was unsure if the pain was asthma or PTSD.  In the ER blood  pressure was 181/107, pulse rate 107 bpm, respiratory rate 22, afebrile.  Labs showed serum creatinine 1.06, WBC 3.6.  High-sensitivity troponin 31> 574.  CT of the chest showed no PE or other acute intrathoracic process.  EKG showed ST 108bpm with nonspecific ST/T wave changes. Patient was given aspirin, nitroglycerin, started on heparin and admitted for further work-up.  The patient lives with her children. She denies alcohol, tobacco or drug use. She has not been taking her cardiac medications in months. She denies active chest pain.    Past Medical History:  Diagnosis Date   Hypertension    MI, acute, non ST segment elevation ([place]) 2020    History reviewed. No pertinent surgical history.   Home Medications:  Prior to Admission medications   Medication Sig Start Date End Date Taking? Authorizing Provider  acetaminophen (TYLENOL) 325 MG tablet Take 650 mg by mouth every 6 (six) hours as needed for headache or mild pain.   Yes [provider]  ibuprofen (ADVIL) 200 MG tablet Take 400 mg by mouth every 6 (six) hours as needed for moderate pain.   Yes [provider]  doxycycline (VIBRA-TABS) 100 MG tablet Take 1 tablet (100 mg total) by mouth 2 (two) times daily. Patient not taking: Reported on 10/20/2022 07/21/19   Menshew, Donna Karvonen, PA-C    Inpatient Medications: Scheduled Meds:  amLODipine  5 mg Oral Daily   aspirin EC  81 mg Oral Daily   atorvastatin  40 mg Oral  Daily   metoprolol tartrate  12.5 mg Oral BID   Continuous Infusions:  heparin 950 Units/hr (10/21/22 0510)   PRN Meds: acetaminophen, ALPRAZolam, hydrALAZINE, HYDROcodone-acetaminophen, morphine injection, nitroGLYCERIN, ondansetron (ZOFRAN) IV, ondansetron **OR** [DISCONTINUED] ondansetron (ZOFRAN) IV  Allergies:    Allergies  Allergen Reactions   Lisinopril Swelling   Losartan Swelling   Mangifera Indica    Amoxicillin Hives, Diarrhea and Rash   Poison Oak Extract    Penicillins Hives  and Rash    Social History:   Social History   Socioeconomic History   Marital status: Married    Spouse name: Not on file   Number of children: Not on file   Years of education: Not on file   Highest education level: Not on file  Occupational History   Not on file  Tobacco Use   Smoking status: Never   Smokeless tobacco: Never  Vaping Use   Vaping Use: Never used  Substance and Sexual Activity   Alcohol use: Never   Drug use: Never   Sexual activity: Not Currently  Other Topics Concern   Not on file  Social History Narrative   Not on file   Social Determinants of Health   Financial Resource Strain: Not on file  Food Insecurity: Not on file  Transportation Needs: Not on file  Physical Activity: Not on file  Stress: Not on file  Social Connections: Not on file  Intimate Partner Violence: Not on file    Family History:   History reviewed. No pertinent family history.   ROS:  Please see the history of present illness.   All other ROS reviewed and negative.     Physical Exam/Data:   Vitals:   10/20/22 2300 10/20/22 2330 10/21/22 0300 10/21/22 0400  BP: (!) 144/75 (!) 163/74 (!) 164/88 139/78  Pulse: 85 93 63 70  Resp: 18 18 (!) 22 (!) 21  Temp:      TempSrc:      SpO2: 97% 97% 97% 90%  Weight:      Height:       No intake or output data in the 24 hours ending 10/21/22 0705    10/20/2022    6:04 PM 07/21/2019    8:00 PM  Last 3 Weights  Weight (lbs) 180 lb 216 lb  Weight (kg) 81.647 kg 97.977 kg     Body mass index is 29.05 kg/m.  General:  Well nourished, well developed, in no acute distress HEENT: normal Neck: no JVD Vascular: No carotid bruits; Distal pulses 2+ bilaterally Cardiac:  normal S1, S2; RRR; no murmur  Lungs:  clear to auscultation bilaterally, no wheezing, rhonchi or rales  Abd: soft, nontender, no hepatomegaly  Ext: no edema Musculoskeletal:  No deformities, BUE and BLE strength normal and equal Skin: warm and dry  Neuro:  CNs  2-12 intact, no focal abnormalities noted Psych:  Normal affect   EKG:  The EKG was personally reviewed and demonstrates:  ST 108bpm, nonspecific ST.T wave changes Telemetry:  Telemetry was personally reviewed and demonstrates:  NSR, frequent PVCs, HR 60s  Relevant CV Studies:  Cath 2021 CONCLUSIONS:   Mild haziness with TIMI-3 flow in the previously stented marginal   Very small first diagonal with ostial 70% stenosis   Very tortuous coronary arteries   Probably normal left ventricular systolic function with ejection  fraction estimated at 55%   Elevated left ventricular end-diastolic pressure post A wave of 30   Mean left atrial pressure of 15  INDICATION: 61 year old with history of prior STEMI of marginal with  recurrent symptoms and elevated troponin for evaluation   Echo 2021 Summary    1. The left ventricle is normal in size with normal wall thickness.    2. The left ventricular systolic function is normal, LVEF is visually  estimated at > 55%.    3. The right ventricle is normal in size, with normal systolic function.    Echocardiogram 01/15/19:  Left ventricular hypertrophy - mild  Normal left ventricular systolic function, ejection fraction > 77%  Diastolic dysfunction - grade I (normal filling pressures)  Normal right ventricular systolic function  Cardiac catheterization 01/14/19: 1. 99% mid OM2, culprit vessel. 2. No other CAD   Summary Interventional Details: Percutaneous coronary intervention performed to the 99% mid OM2 lesion. Anticoagulants:  Ticagrelor or Unfractionated Heparin (allergic to aspirin) Guide catheter: 6 Fr EBU 3.5 Guide Wire: 0.014" Runthrough Pre-Dilation Balloon: 2.0 x 12 mm TREK balloon up to 14 atm Stent:   Resolute Onyx (drug eluting stent) 2.25 x 30 mm up to 20 atm  Post-Dilation Balloon:  2.5 x 10 mm Sapphire [place] balloon up to 18 atm Result:   Excellent angiographic result with TIMI 3 flow.  Cammie Mcgee,  AGNP-C Cardiology Nurse Practitioner [facility]  Laboratory Data:  High Sensitivity Troponin:   Recent Labs  Lab 10/20/22 1807 10/20/22 2020 10/21/22 0257 10/21/22 0507  TROPONINIHS 31* 574* 2,154* 1,665*     Chemistry Recent Labs  Lab 10/20/22 1807  NA 140  K 3.9  CL 108  CO2 25  GLUCOSE 163*  BUN 18  CREATININE 1.06*  CALCIUM 9.2  GFRNONAA >60  ANIONGAP 7    No results for input(s): "PROT", "ALBUMIN", "AST", "ALT", "ALKPHOS", "BILITOT" in the last 168 hours. Lipids No results for input(s): "CHOL", "TRIG", "HDL", "LABVLDL", "LDLCALC", "CHOLHDL" in the last 168 hours.  Hematology Recent Labs  Lab 10/20/22 1807  WBC 3.6*  RBC 4.84  HGB 12.9  HCT 38.3  MCV 79.1*  MCH 26.7  MCHC 33.7  RDW 13.2  PLT 152   Thyroid No results for input(s): "TSH", "FREET4" in the last 168 hours.  BNPNo results for input(s): "BNP", "PROBNP" in the last 168 hours.  DDimer No results for input(s): "DDIMER" in the last 168 hours.   Radiology/Studies:  CT Angio Chest Aorta W and/or Wo Contrast  Result Date: 10/20/2022 CLINICAL DATA:  Left chest pressure and shortness of breath. EXAM: CT ANGIOGRAPHY CHEST WITH CONTRAST TECHNIQUE: Multidetector CT imaging of the chest was performed using the standard protocol during bolus administration of intravenous contrast. Multiplanar CT image reconstructions and MIPs were obtained to evaluate the vascular anatomy. RADIATION DOSE REDUCTION: This exam was performed according to the departmental dose-optimization program which includes automated exposure control, adjustment of the mA and/or kV according to patient size and/or use of iterative reconstruction technique. CONTRAST:  131m OMNIPAQUE IOHEXOL 350 MG/ML SOLN COMPARISON:  November 14, 2007 FINDINGS: Cardiovascular: Satisfactory opacification of the pulmonary arteries to the segmental level. No evidence of pulmonary embolism. Normal heart size. No pericardial effusion. Mediastinum/Nodes:  No enlarged mediastinal, hilar, or axillary lymph nodes. Small bilateral thyroid nodules are noted. The trachea and esophagus demonstrate no significant findings. Lungs/Pleura: Lungs are clear. No pleural effusion or pneumothorax. Upper Abdomen: A 13 mm diameter round hypodense lesion is seen within the anterior aspect of the right lobe of the liver. Additional subcentimeter foci of low attenuation are noted within the left lobe of the  thyroid gland. Musculoskeletal: No chest wall abnormality. No acute or significant osseous findings. Review of the MIP images confirms the above findings. IMPRESSION: 1. No evidence of pulmonary embolism or other acute intrathoracic process. 2. Small bilateral thyroid nodules. No follow-up imaging is recommended. This follows ACR consensus guidelines: Managing Incidental Thyroid Nodules Detected on Imaging: White Paper of the ACR Incidental Thyroid Findings Committee. J Am Coll Radiol 2015; 12:143-150. 3. Findings which may represent small hepatic cysts and/or hemangiomas. Correlation with nonemergent hepatic ultrasound is recommended. Electronically Signed   By: Virgina Norfolk M.D.   On: 10/20/2022 21:06   DG Chest 2 View  Result Date: 10/20/2022 CLINICAL DATA:  Left chest pressure and shortness of breath starting today EXAM: CHEST - 2 VIEW COMPARISON:  11/11/2012 FINDINGS: The heart size and mediastinal contours are within normal limits. Both lungs are clear. The visualized skeletal structures are unremarkable. IMPRESSION: No active cardiopulmonary disease. Electronically Signed   By: Van Clines M.D.   On: 10/20/2022 18:29     Assessment and Plan:   NSTEMI CAD s/p DES OM2 in 12/2018 SCAD -Patient presented with chest pain found to have troponin greater than 2000 -CT of the chest showed no PE or other acute process - EKG with ST and nonspecific changes. Tele shows frequent PVCs - patient has been noncompliant with cardiac medications. - she is chest pain  free -Continue IV heparin -Continue aspirin 81 mg daily, Lipitor 40 mg daily, Lopressor 12.5 mg twice daily -Check echo -Last cath at [facility] in 06/13/2020 showed very small first diagonal with ostial 70% stenosis, very tortuous coronary arteries, EF 55%, possible scad of small ramus branch.  Medical management was recommended. - plan for heart cath on Monday Risks and benefits of cardiac catheterization have been discussed with the patient.  These include bleeding, infection, kidney damage, stroke, heart attack, death.  The patient understands these risks and is willing to proceed.   HTN -BP severely elevated on presentation -PTA not on antihypertensives - started on lopressor 12.48m BID - BP slowly improving  HLD - LDL 58 in 2021.  - Repeat lipid panel - Continue Lipitor 451mdaily   For questions or updates, please contact [place] consult [email or web address] for contact info under    Signed, Nikaela Coyne H Ninfa MeekerPA-C  10/21/2022 7:05 AM

## 2022-10-21 NOTE — Progress Notes (Signed)
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  Initial troponin at 31, but subsequent sets continue to trend upwards, peaking at 2100 within 9 hours of presentation to the emergency room.  Patient also found to have markedly elevated blood pressures with systolic in the 818E.  Patient admitted to the hospitalist service for hypertensive emergency and non-STEMI.  Cardiology consulted.  Assessment and Plan: Assessment and Plan: * Hypertensive emergency Patient noncompliant with BP meds with BP 181/107 on arrival  Improved with labetalol 10 mg x 1 dose in the ED Restart prior antihypertensives  Lopressor 12.5 mg twice daily.   Will hold prior losartan as patient reports possible mouth swelling(will update allergy list).  Appreciate cardiology help.  Patient cites odd behavioral issues as cause for her noncompliance, stating that the blood pressure medications made her agitated somewhat to hit people.    NSTEMI (non-ST elevated myocardial infarction) ([place]) CAD with history of PCI 2020 and 2021 for NSTEMI.  Troponin maxed around 2100.  Has been trending downward.  Continue aspirin, nitroglycerin and statin.  Possible this could be demand ischemia.  Left heart catheterization planned for 10/30.   Chronic diastolic CHF (congestive heart failure) (HCC) Echocardiogram notes grade 1 diastolic dysfunction.  Checking BNP.  Obstructive sleep apnea Not currently on CPAP  Abnormal TSH Patient appears to hyperthyroidism.  TSH quite low, checking free T3 and free  T4  Noncompliance, with medication, CPAP and follow-up Counseling provided regarding risk to overall prognosis TOC consult  Overweight (BMI 25.0-29.9) Meets criteria for BMI greater than 25       Body mass index is 29.05 kg/m.        Consultants: Cardiology  Procedures: Echocardiogram noted grade 1 diastolic dysfunction and preserved ejection fraction  Antimicrobials: None  Code Status: Full code   Subjective: Patient states she feels okay now  Objective: Vital signs were reviewed and unremarkable. Vitals:   10/21/22 1219 10/21/22 1610  BP: 136/80 123/65  Pulse: 62 69  Resp: 17 18  Temp: 97.8 F (36.6 C)   SpO2: 99% 97%    Intake/Output Summary (Last 24 hours) at 10/21/2022 1633 Last data filed at 10/21/2022 1407 Gross per 24 hour  Intake 360 ml  Output --  Net 360 ml   Filed Weights   10/20/22 1804  Weight: 81.6 kg   Body mass index is 29.05 kg/m.  Exam:  General: Alert and oriented x3, no acute distress HEENT: Normocephalic, atraumatic, mucous membranes are moist, poor dentition Cardiovascular: Regular rate and rhythm, S1-S2, 2 out of 6 systolic ejection murmur Respiratory: Clear to auscultation bilaterally Abdomen: Soft, nontender, nondistended, positive bowel sounds Musculoskeletal: No clubbing or cyanosis or edema Skin: No skin breaks, tears or lesions Psychiatry: Appropriate, no evidence of psychoses Neurology: No focal deficits  Data Reviewed: TSH of 0.034  Disposition:  Status is: Inpatient Remains inpatient appropriate because:  -Evaluation of thyroid function -Heart catheterization    Anticipated discharge date: 10/30  Family Communication: Will call son DVT Prophylaxis:   Heparin infusion    Author: Annita Brod ,MD  10/21/2022 4:33 PM  To reach On-call, see care teams to locate the attending and reach out via [email or web address]. Between 7PM-7AM, please contact night-coverage If you still have difficulty reaching  the attending provider, please page the [facility] (Director on Call) for [facility] on amion for assistance.

## 2022-10-21 NOTE — Hospital Course (Signed)
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  Initial troponin at 31, but subsequent sets continue to trend upwards, peaking at 2100 within 9 hours of presentation to the emergency room.  Patient also found to have markedly elevated blood pressures with systolic in the 940H.  Patient admitted to the hospitalist service for hypertensive emergency and non-STEMI.  Cardiology consulted.

## 2022-10-21 NOTE — Assessment & Plan Note (Signed)
Meets criteria for BMI greater than 25 

## 2022-10-21 NOTE — Assessment & Plan Note (Signed)
Echocardiogram notes grade 1 diastolic dysfunction.  Looks to be euvolemic.

## 2022-10-21 NOTE — Assessment & Plan Note (Signed)
Patient noted to have TSH of 0.034 and mildly elevated free T4 of 1.17.  Possible this could be in the setting of uncontrolled hypertension and so recommending checking repeat thyroid function studies in 1 month as outpatient follow-up rather than say right now if this is hyperthyroidism.

## 2022-10-21 NOTE — Progress Notes (Signed)
ANTICOAGULATION CONSULT NOTE   Pharmacy Consult for IV heparin Indication: chest pain/ACS  Allergies  Allergen Reactions   Lisinopril Swelling   Losartan Swelling   Mangifera Indica    Amoxicillin Hives, Diarrhea and Rash   Poison Oak Extract    Penicillins Hives and Rash    Patient Measurements: Height: 5\' 6"  (167.6 cm) Weight: 81.6 kg (180 lb) IBW/kg (Calculated) : 59.3 Heparin Dosing Weight: 76.4 kg  Vital Signs: Temp: 98.1 F (36.7 C) (10/27 1807) Temp Source: Oral (10/27 1807) BP: 164/88 (10/28 0300) Pulse Rate: 63 (10/28 0300)  Labs: Recent Labs    10/20/22 1807 10/20/22 2020 10/21/22 0257  HGB 12.9  --   --   HCT 38.3  --   --   PLT 152  --   --   CREATININE 1.06*  --   --   TROPONINIHS 31* 574* 2,154*    Estimated Creatinine Clearance: 61.5 mL/min (A) (by C-G formula based on SCr of 1.06 mg/dL (H)).   Medical History: Past Medical History:  Diagnosis Date   Hypertension    MI, acute, non ST segment elevation ([place]) 2020    Medications:  (Not in a hospital admission)  Scheduled:   amLODipine  5 mg Oral Daily   aspirin EC  81 mg Oral Daily   atorvastatin  40 mg Oral Daily   enoxaparin (LOVENOX) injection  40 mg Subcutaneous Q24H   metoprolol tartrate  12.5 mg Oral BID   Infusions:  PRN: acetaminophen, ALPRAZolam, hydrALAZINE, HYDROcodone-acetaminophen, morphine injection, nitroGLYCERIN, ondansetron (ZOFRAN) IV, ondansetron **OR** [DISCONTINUED] ondansetron (ZOFRAN) IV Anti-infectives (From admission, onward)    None      Not on chronic PTA anticoagulation per my chart review  Assessment: 61 year old female presenting with chest pain and shortness of breath. Troponins elevated. Pharmacy has been consulted to start heparin for ACS.  H&H stable. Baseline aPTT and PT-INR ordered.   Goal of Therapy:  Heparin level 0.3-0.7 units/ml Monitor platelets by anticoagulation protocol: Yes   Plan:  Give 4,000 units bolus x 1 Start heparin  infusion at 950 units/hr Check anti-Xa level in 6 hours and daily while on heparin Continue to monitor H&H and platelets Please draw baseline labs prior to initiation of heparin if possible  Wynelle Cleveland 10/21/2022,3:59 AM

## 2022-10-21 NOTE — Progress Notes (Signed)
*  PRELIMINARY RESULTS* Echocardiogram 2D Echocardiogram has been performed.  Donna Prince 10/21/2022, 2:18 PM

## 2022-10-21 NOTE — Progress Notes (Signed)
ANTICOAGULATION CONSULT NOTE   Pharmacy Consult for IV heparin Indication: chest pain/ACS  Allergies  Allergen Reactions   Lisinopril Swelling   Losartan Swelling   Mangifera Indica    Amoxicillin Hives, Diarrhea and Rash   Poison Oak Extract    Penicillins Hives and Rash    Patient Measurements: Height: 5\' 6"  (167.6 cm) Weight: 81.6 kg (180 lb) IBW/kg (Calculated) : 59.3 Heparin Dosing Weight: 76.4 kg  Vital Signs: Temp: 97.8 F (36.6 C) (10/28 1219) BP: 123/65 (10/28 1610) Pulse Rate: 69 (10/28 1610)  Labs: Recent Labs    10/20/22 1807 10/20/22 2020 10/21/22 0257 10/21/22 0507 10/21/22 1107 10/21/22 1649  HGB 12.9  --   --   --  12.5  --   HCT 38.3  --   --   --  37.1  --   PLT 152  --   --   --  158  --   APTT  --   --   --  30  --   --   LABPROT  --   --   --  13.8  --   --   INR  --   --   --  1.1  --   --   HEPARINUNFRC  --   --   --   --  0.38 0.30  CREATININE 1.06*  --   --   --   --   --   TROPONINIHS 31* 574* 2,154* 1,665*  --   --      Estimated Creatinine Clearance: 61.5 mL/min (A) (by C-G formula based on SCr of 1.06 mg/dL (H)).   Medical History: Past Medical History:  Diagnosis Date   Hypertension    MI, acute, non ST segment elevation ([place]) 2020    Medications:  Medications Prior to Admission  Medication Sig Dispense Refill Last Dose   acetaminophen (TYLENOL) 325 MG tablet Take 650 mg by mouth every 6 (six) hours as needed for headache or mild pain.   prn at prn   ibuprofen (ADVIL) 200 MG tablet Take 400 mg by mouth every 6 (six) hours as needed for moderate pain.   prn at prn   doxycycline (VIBRA-TABS) 100 MG tablet Take 1 tablet (100 mg total) by mouth 2 (two) times daily. (Patient not taking: Reported on 10/20/2022) 20 tablet 0 Completed Course   Scheduled:   amLODipine  5 mg Oral Daily   aspirin EC  81 mg Oral Daily   atorvastatin  40 mg Oral Daily   carvedilol  25 mg Oral BID WC   hydrALAZINE  50 mg Oral Q8H   Infusions:    heparin 950 Units/hr (10/21/22 0510)   PRN: acetaminophen, ALPRAZolam, hydrALAZINE, HYDROcodone-acetaminophen, morphine injection, nitroGLYCERIN, ondansetron (ZOFRAN) IV, ondansetron **OR** [DISCONTINUED] ondansetron (ZOFRAN) IV Anti-infectives (From admission, onward)    None      Not on chronic PTA anticoagulation per my chart review  Assessment: 61 year old female presenting with chest pain and shortness of breath. Troponins elevated. Pharmacy has been consulted to start heparin for ACS.  10/28 11:07 HL 0.38 10/28 1649 HL 0.30   Goal of Therapy:  Heparin level 0.3-0.7 units/ml Monitor platelets by anticoagulation protocol: Yes   Plan:  Heparin level is therapeutic x2. Will continue heparin infusion at 950 units/hr and recheck heparin level with am labs. CBC daily while on heparin. Plan for [facility] Monday.    Darnelle Bos, PharmD 10/21/2022,5:18 PM

## 2022-10-21 NOTE — ED Notes (Signed)
Alf RN aware of assigned bed

## 2022-10-22 DIAGNOSIS — I161 Hypertensive emergency: Secondary | ICD-10-CM | POA: Diagnosis not present

## 2022-10-22 DIAGNOSIS — I169 Hypertensive crisis, unspecified: Secondary | ICD-10-CM | POA: Diagnosis not present

## 2022-10-22 LAB — COMPREHENSIVE METABOLIC PANEL
ALT: 18 U/L (ref 0–44)
AST: 18 U/L (ref 15–41)
Albumin: 3.9 g/dL (ref 3.5–5.0)
Alkaline Phosphatase: 88 U/L (ref 38–126)
Anion gap: 7 (ref 5–15)
BUN: 18 mg/dL (ref 6–20)
CO2: 26 mmol/L (ref 22–32)
Calcium: 9.3 mg/dL (ref 8.9–10.3)
Chloride: 105 mmol/L (ref 98–111)
Creatinine, Ser: 0.67 mg/dL (ref 0.44–1.00)
GFR, Estimated: >60 mL/min (ref >60)
Glucose, Bld: 99 mg/dL (ref 70–99)
Potassium: 4.2 mmol/L (ref 3.5–5.1)
Sodium: 138 mmol/L (ref 135–145)
Total Bilirubin: 0.7 mg/dL (ref 0.3–1.2)
Total Protein: 6.6 g/dL (ref 6.5–8.1)

## 2022-10-22 LAB — HEPARIN LEVEL (UNFRACTIONATED)
Heparin Unfractionated: 0.2 [IU]/mL — ABNORMAL LOW (ref 0.30–0.70)
Heparin Unfractionated: 0.32 IU/mL (ref 0.30–0.70)
Heparin Unfractionated: 0.35 IU/mL (ref 0.30–0.70)

## 2022-10-22 LAB — LIPID PANEL
Cholesterol: 183 mg/dL (ref 0–200)
HDL: 55 mg/dL (ref >40)
LDL Cholesterol: 111 mg/dL — ABNORMAL HIGH (ref 0–99)
Total CHOL/HDL Ratio: 3.3 RATIO
Triglycerides: 87 mg/dL (ref <150)
VLDL: 17 mg/dL (ref 0–40)

## 2022-10-22 LAB — CBC
HCT: 38.2 % (ref 36.0–46.0)
Hemoglobin: 13 g/dL (ref 12.0–15.0)
MCH: 27.3 pg (ref 26.0–34.0)
MCHC: 34 g/dL (ref 30.0–36.0)
MCV: 80.1 fL (ref 80.0–100.0)
Platelets: 163 10*3/uL (ref 150–400)
RBC: 4.77 MIL/uL (ref 3.87–5.11)
RDW: 13.2 % (ref 11.5–15.5)
WBC: 5 10*3/uL (ref 4.0–10.5)
nRBC: 0 % (ref 0.0–0.2)

## 2022-10-22 LAB — TROPONIN I (HIGH SENSITIVITY): Troponin I (High Sensitivity): 566 ng/L (ref <18)

## 2022-10-22 MED ORDER — FLUTICASONE PROPIONATE 50 MCG/ACT NA SUSP
1.0000 | Freq: Every day | NASAL | Status: DC
  Administered 2022-10-22 – 2022-10-23 (×2): 1 via NASAL
  Filled 2022-10-22: qty 16

## 2022-10-22 MED ORDER — HEPARIN BOLUS VIA INFUSION
2200.0000 [IU] | Freq: Once | INTRAVENOUS | Status: AC
  Administered 2022-10-22: 2200 [IU] via INTRAVENOUS
  Filled 2022-10-22: qty 2200

## 2022-10-22 MED ORDER — ATORVASTATIN CALCIUM 80 MG PO TABS
80.0000 mg | ORAL_TABLET | Freq: Every day | ORAL | Status: DC
  Administered 2022-10-23: 80 mg via ORAL
  Filled 2022-10-22: qty 1

## 2022-10-22 NOTE — Progress Notes (Signed)
ANTICOAGULATION CONSULT NOTE   Pharmacy Consult for IV heparin Indication: chest pain/ACS  Allergies  Allergen Reactions   Lisinopril Swelling   Losartan Swelling   Mangifera Indica    Amoxicillin Hives, Diarrhea and Rash   Poison Oak Extract    Penicillins Hives and Rash    Patient Measurements: Height: 5\' 6"  (167.6 cm) Weight: 81.6 kg (180 lb) IBW/kg (Calculated) : 59.3 Heparin Dosing Weight: 76.4 kg  Vital Signs: Temp: 97.8 F (36.6 C) (10/29 1634) Temp Source: Oral (10/29 1634) BP: 118/69 (10/29 1634) Pulse Rate: 76 (10/29 1634)  Labs: Recent Labs    10/20/22 1807 10/20/22 2020 10/21/22 0257 10/21/22 0507 10/21/22 1107 10/21/22 1649 10/22/22 0439 10/22/22 1144 10/22/22 1809  HGB 12.9  --   --   --  12.5  --  13.0  --   --   HCT 38.3  --   --   --  37.1  --  38.2  --   --   PLT 152  --   --   --  158  --  163  --   --   APTT  --   --   --  30  --   --   --   --   --   LABPROT  --   --   --  13.8  --   --   --   --   --   INR  --   --   --  1.1  --   --   --   --   --   HEPARINUNFRC  --   --   --   --  0.38   < > 0.20* 0.32 0.35  CREATININE 1.06*  --   --   --   --   --  0.67  --   --   TROPONINIHS 31*   < > 2,154* 1,665*  --   --  566*  --   --    < > = values in this interval not displayed.     Estimated Creatinine Clearance: 81.5 mL/min (by C-G formula based on SCr of 0.67 mg/dL).   Medical History: Past Medical History:  Diagnosis Date   Hypertension    MI, acute, non ST segment elevation ([place]) 2020    Medications:  Medications Prior to Admission  Medication Sig Dispense Refill Last Dose   acetaminophen (TYLENOL) 325 MG tablet Take 650 mg by mouth every 6 (six) hours as needed for headache or mild pain.   prn at prn   ibuprofen (ADVIL) 200 MG tablet Take 400 mg by mouth every 6 (six) hours as needed for moderate pain.   prn at prn   doxycycline (VIBRA-TABS) 100 MG tablet Take 1 tablet (100 mg total) by mouth 2 (two) times daily. (Patient not  taking: Reported on 10/20/2022) 20 tablet 0 Completed Course   Scheduled:   amLODipine  5 mg Oral Daily   aspirin EC  81 mg Oral Daily   [START ON 10/23/2022] atorvastatin  80 mg Oral Daily   carvedilol  25 mg Oral BID WC   fluticasone  1 spray Each Nare Daily   hydrALAZINE  50 mg Oral Q8H   Infusions:   heparin 1,200 Units/hr (10/22/22 0527)   PRN: acetaminophen, ALPRAZolam, hydrALAZINE, HYDROcodone-acetaminophen, morphine injection, nitroGLYCERIN, ondansetron (ZOFRAN) IV, ondansetron **OR** [DISCONTINUED] ondansetron (ZOFRAN) IV, mouth rinse Anti-infectives (From admission, onward)    None      Not on chronic  PTA anticoagulation per my chart review  Assessment: 61 year old female presenting with chest pain and shortness of breath. Troponins elevated. Pharmacy has been consulted to start heparin for ACS.  10/28 11:07 HL 0.38 10/28 1649 HL 0.30 10/29 0509 HL 0.20 10/29 1144 HL 0.32  10/29 1809 HL 0.35   Goal of Therapy:  Heparin level 0.3-0.7 units/ml Monitor platelets by anticoagulation protocol: Yes   Plan:  Heparin level is therapeutic x2. Will continue heparin infusion at 1200 units/hr. Check heparin level with am labs. CBC daily while on heparin. Plan for [facility] Monday.     Darnelle Bos, PharmD 10/22/2022,6:48 PM

## 2022-10-22 NOTE — H&P (View-Only) (Signed)
Cardiology Progress Note  Patient ID: JENICA SZYMKOWIAK MRN: [id number] DOB: 05/29/1961 Date of Encounter: 10/22/2022  Primary Cardiologist: None  Subjective   Chief Complaint: None.  HPI: Chest pain.  Plan for left heart catheterization tomorrow.  ROS:  All other ROS reviewed and negative. Pertinent positives noted in the HPI.     Inpatient Medications  Scheduled Meds:  amLODipine  5 mg Oral Daily   aspirin EC  81 mg Oral Daily   atorvastatin  40 mg Oral Daily   carvedilol  25 mg Oral BID WC   hydrALAZINE  50 mg Oral Q8H   Continuous Infusions:  heparin 1,200 Units/hr (10/22/22 0527)   PRN Meds: acetaminophen, ALPRAZolam, hydrALAZINE, HYDROcodone-acetaminophen, morphine injection, nitroGLYCERIN, ondansetron (ZOFRAN) IV, ondansetron **OR** [DISCONTINUED] ondansetron (ZOFRAN) IV, mouth rinse   Vital Signs   Vitals:   10/21/22 1940 10/21/22 2305 10/22/22 0448 10/22/22 0748  BP: 137/74 (!) 140/75 132/77 118/69  Pulse: 70 71 66 81  Resp: 19 19 20 18   Temp: 97.6 F (36.4 C) 97.7 F (36.5 C) 97.8 F (36.6 C) 97.6 F (36.4 C)  TempSrc:    Oral  SpO2: 98% 98% 98% 98%  Weight:      Height:        Intake/Output Summary (Last 24 hours) at 10/22/2022 1133 Last data filed at 10/22/2022 0319 Gross per 24 hour  Intake 570.43 ml  Output --  Net 570.43 ml      10/20/2022    6:04 PM 07/21/2019    8:00 PM  Last 3 Weights  Weight (lbs) 180 lb 216 lb  Weight (kg) 81.647 kg 97.977 kg      Telemetry  Overnight telemetry shows sinus rhythm with PVCs, which I personally reviewed.   ECG  The most recent ECG shows sinus rhythm heart rate 69, no acute ischemic changes or evidence of infarction, which I personally reviewed.   Physical Exam   Vitals:   10/21/22 1940 10/21/22 2305 10/22/22 0448 10/22/22 0748  BP: 137/74 (!) 140/75 132/77 118/69  Pulse: 70 71 66 81  Resp: 19 19 20 18   Temp: 97.6 F (36.4 C) 97.7 F (36.5 C) 97.8 F (36.6 C) 97.6 F (36.4 C)  TempSrc:     Oral  SpO2: 98% 98% 98% 98%  Weight:      Height:        Intake/Output Summary (Last 24 hours) at 10/22/2022 1133 Last data filed at 10/22/2022 0319 Gross per 24 hour  Intake 570.43 ml  Output --  Net 570.43 ml       10/20/2022    6:04 PM 07/21/2019    8:00 PM  Last 3 Weights  Weight (lbs) 180 lb 216 lb  Weight (kg) 81.647 kg 97.977 kg    Body mass index is 29.05 kg/m.  General: Well nourished, well developed, in no acute distress Head: Atraumatic, normal size  Eyes: PEERLA, EOMI  Neck: Supple, no JVD Endocrine: No thryomegaly Cardiac: Normal S1, S2; RRR; no murmurs, rubs, or gallops Lungs: Clear to auscultation bilaterally, no wheezing, rhonchi or rales  Abd: Soft, nontender, no hepatomegaly  Ext: No edema, pulses 2+ Musculoskeletal: No deformities, BUE and BLE strength normal and equal Skin: Warm and dry, no rashes   Neuro: Alert and oriented to person, place, time, and situation, CNII-XII grossly intact, no focal deficits  Psych: Normal mood and affect   Labs  High Sensitivity Troponin:   Recent Labs  Lab 10/20/22 1807 10/20/22 2020 10/21/22 0257 10/21/22 0507  10/22/22 0439  TROPONINIHS 31* 574* 2,154* 1,665* 566*     Cardiac EnzymesNo results for input(s): "TROPONINI" in the last 168 hours. No results for input(s): "TROPIPOC" in the last 168 hours.  Chemistry Recent Labs  Lab 10/20/22 1807 10/22/22 0439  NA 140 138  K 3.9 4.2  CL 108 105  CO2 25 26  GLUCOSE 163* 99  BUN 18 18  CREATININE 1.06* 0.67  CALCIUM 9.2 9.3  PROT  --  6.6  ALBUMIN  --  3.9  AST  --  18  ALT  --  18  ALKPHOS  --  88  BILITOT  --  0.7  GFRNONAA >60 >60  ANIONGAP 7 7    Hematology Recent Labs  Lab 10/20/22 1807 10/21/22 1107 10/22/22 0439  WBC 3.6* 4.9 5.0  RBC 4.84 4.76 4.77  HGB 12.9 12.5 13.0  HCT 38.3 37.1 38.2  MCV 79.1* 77.9* 80.1  MCH 26.7 26.3 27.3  MCHC 33.7 33.7 34.0  RDW 13.2 13.2 13.2  PLT 152 158 163   BNP Recent Labs  Lab 10/21/22 1649  BNP  200.0*    DDimer No results for input(s): "DDIMER" in the last 168 hours.   Radiology  ECHOCARDIOGRAM COMPLETE  Result Date: 10/21/2022    ECHOCARDIOGRAM REPORT   Patient Name:   FALLYNN GRAVETT Date of Exam: 10/21/2022 Medical Rec #:  [id number]       Height:       66.0 in Accession #:    [id number]      Weight:       180.0 lb Date of Birth:  03-18-1961        BSA:          1.912 m Patient Age:    61 years        BP:           136/80 mmHg Patient Gender: F               HR:           74 bpm. Exam Location:  [facility] Procedure: 2D Echo, Cardiac Doppler and Color Doppler Indications:     NSTEMI  History:         Patient has no prior history of Echocardiogram examinations.                  Acute MI and CAD; Risk Factors:Hypertension.  Sonographer:     Wenda Low Referring Phys:  [id number] Athena Masse Diagnosing Phys: [place]  1. Left ventricular ejection fraction, by estimation, is 60 to 65%. The left ventricle has normal function. The left ventricle has no regional wall motion abnormalities. The left ventricular internal cavity size was mildly dilated. There is severe concentric left ventricular hypertrophy. Left ventricular diastolic parameters are consistent with Grade I diastolic dysfunction (impaired relaxation).  2. Right ventricular systolic function is mildly reduced. The right ventricular size is mildly enlarged. Mildly increased right ventricular wall thickness. There is normal pulmonary artery systolic pressure.  3. Left atrial size was moderately dilated.  4. Right atrial size was moderately dilated.  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 Mild mitral valve regurgitation.  6. Tricuspid valve regurgitation is mild to moderate.  7. The aortic valve is calcified. Aortic valve regurgitation is trivial. Aortic valve sclerosis/calcification is present, without any evidence of aortic stenosis. FINDINGS  Left Ventricle: Left ventricular ejection fraction, by estimation, is 60 to 65%. The left  ventricle has normal function. The  left ventricle has no regional wall motion abnormalities. The left ventricular internal cavity size was mildly dilated. There is  severe concentric left ventricular hypertrophy. Left ventricular diastolic parameters are consistent with Grade I diastolic dysfunction (impaired relaxation). Right Ventricle: The right ventricular size is mildly enlarged. Mildly increased right ventricular wall thickness. Right ventricular systolic function is mildly reduced. There is normal pulmonary artery systolic pressure. The tricuspid regurgitant velocity is 2.57 m/s, and with an assumed right atrial pressure of 3 mmHg, the estimated right ventricular systolic pressure is [id number]. Left Atrium: Left atrial size was moderately dilated. Right Atrium: Right atrial size was moderately dilated. Pericardium: There is no evidence of pericardial effusion. Mitral Valve: The mitral valve is myxomatous. Mild mitral valve regurgitation. MV peak gradient, 5.0 mmHg. The mean mitral valve gradient is 2.0 mmHg. Tricuspid Valve: The tricuspid valve is grossly normal. Tricuspid valve regurgitation is mild to moderate. Aortic Valve: The aortic valve is calcified. Aortic valve regurgitation is trivial. Aortic valve sclerosis/calcification is present, without any evidence of aortic stenosis. Aortic valve mean gradient measures 5.0 mmHg. Aortic valve peak gradient measures 9.0 mmHg. Aortic valve area, by VTI measures 2.65 cm. Pulmonic Valve: The pulmonic valve was grossly normal. Pulmonic valve regurgitation is trivial. Aorta: The aortic root, ascending aorta and aortic arch are all structurally normal, with no evidence of dilitation or obstruction. IAS/Shunts: No atrial level shunt detected by color flow Doppler.  LEFT VENTRICLE PLAX 2D LVIDd:         4.40 cm   Diastology LVIDs:         3.10 cm   LV e' medial:    5.98 cm/s LV PW:         1.10 cm   LV E/e' medial:  12.2 LV IVS:        1.20 cm   LV e' lateral:   8.16  cm/s LVOT diam:     2.10 cm   LV E/e' lateral: 9.0 LV SV:         94 LV SV Index:   49 LVOT Area:     3.46 cm  RIGHT VENTRICLE RV Basal diam:  3.30 cm RV Mid diam:    2.90 cm RV S prime:     16.20 cm/s TAPSE (M-mode): 2.5 cm LEFT ATRIUM             Index        RIGHT ATRIUM           Index LA diam:        4.20 cm 2.20 cm/m   RA Area:     13.70 cm LA Vol (A2C):   76.8 ml 40.16 ml/m  RA Volume:   32.60 ml  17.05 ml/m LA Vol (A4C):   55.1 ml 28.81 ml/m LA Biplane Vol: 69.7 ml 36.45 ml/m  AORTIC VALVE                     PULMONIC VALVE AV Area (Vmax):    3.07 cm      PV Vmax:       1.15 m/s AV Area (Vmean):   2.70 cm      PV Peak grad:  5.3 mmHg AV Area (VTI):     2.65 cm AV Vmax:           150.00 cm/s AV Vmean:          113.000 cm/s AV VTI:  0.356 m AV Peak Grad:      9.0 mmHg AV Mean Grad:      5.0 mmHg LVOT Vmax:         133.00 cm/s LVOT Vmean:        88.200 cm/s LVOT VTI:          0.272 m LVOT/AV VTI ratio: 0.76  AORTA Ao Root diam: 3.10 cm MITRAL VALVE               TRICUSPID VALVE MV Area (PHT): 2.81 cm    TR Peak grad:   26.4 mmHg MV Area VTI:   2.83 cm    TR Vmax:        257.00 cm/s MV Peak grad:  5.0 mmHg MV Mean grad:  2.0 mmHg    SHUNTS MV Vmax:       1.12 m/s    Systemic VTI:  0.27 m MV Vmean:      65.0 cm/s   Systemic Diam: 2.10 cm MV Decel Time: 270 msec MV E velocity: 73.20 cm/s MV A velocity: 98.20 cm/s MV E/A ratio:  0.75 Shaukat Khan Electronically signed by Neoma Laming Signature Date/Time: 10/21/2022/2:22:22 PM    Final    CT Angio Chest Aorta W and/or Wo Contrast  Result Date: 10/20/2022 CLINICAL DATA:  Left chest pressure and shortness of breath. EXAM: CT ANGIOGRAPHY CHEST WITH CONTRAST TECHNIQUE: Multidetector CT imaging of the chest was performed using the standard protocol during bolus administration of intravenous contrast. Multiplanar CT image reconstructions and MIPs were obtained to evaluate the vascular anatomy. RADIATION DOSE REDUCTION: This exam was performed  according to the departmental dose-optimization program which includes automated exposure control, adjustment of the mA and/or kV according to patient size and/or use of iterative reconstruction technique. CONTRAST:  138mL OMNIPAQUE IOHEXOL 350 MG/ML SOLN COMPARISON:  November 14, 2007 FINDINGS: Cardiovascular: Satisfactory opacification of the pulmonary arteries to the segmental level. No evidence of pulmonary embolism. Normal heart size. No pericardial effusion. Mediastinum/Nodes: No enlarged mediastinal, hilar, or axillary lymph nodes. Small bilateral thyroid nodules are noted. The trachea and esophagus demonstrate no significant findings. Lungs/Pleura: Lungs are clear. No pleural effusion or pneumothorax. Upper Abdomen: A 13 mm diameter round hypodense lesion is seen within the anterior aspect of the right lobe of the liver. Additional subcentimeter foci of low attenuation are noted within the left lobe of the thyroid gland. Musculoskeletal: No chest wall abnormality. No acute or significant osseous findings. Review of the MIP images confirms the above findings. IMPRESSION: 1. No evidence of pulmonary embolism or other acute intrathoracic process. 2. Small bilateral thyroid nodules. No follow-up imaging is recommended. This follows ACR consensus guidelines: Managing Incidental Thyroid Nodules Detected on Imaging: White Paper of the ACR Incidental Thyroid Findings Committee. J Am Coll Radiol 2015; 12:143-150. 3. Findings which may represent small hepatic cysts and/or hemangiomas. Correlation with nonemergent hepatic ultrasound is recommended. Electronically Signed   By: Virgina Norfolk M.D.   On: 10/20/2022 21:06   DG Chest 2 View  Result Date: 10/20/2022 CLINICAL DATA:  Left chest pressure and shortness of breath starting today EXAM: CHEST - 2 VIEW COMPARISON:  11/11/2012 FINDINGS: The heart size and mediastinal contours are within normal limits. Both lungs are clear. The visualized skeletal structures  are unremarkable. IMPRESSION: No active cardiopulmonary disease. Electronically Signed   By: Van Clines M.D.   On: 10/20/2022 18:29    Cardiac Studies   Left heart cath at [facility] 2020/06/28 D1 70% OM2 20% (in-stent  restenosis)   Left heart cath at [facility] 01/2019 OM2 99% -> PCI  TTE 10/21/2022  1. Left ventricular ejection fraction, by estimation, is 60 to 65%. The  left ventricle has normal function. The left ventricle has no regional  wall motion abnormalities. The left ventricular internal cavity size was  mildly dilated. There is severe  concentric left ventricular hypertrophy. Left ventricular diastolic  parameters are consistent with Grade I diastolic dysfunction (impaired  relaxation).   2. Right ventricular systolic function is mildly reduced. The right  ventricular size is mildly enlarged. Mildly increased right ventricular  wall thickness. There is normal pulmonary artery systolic pressure.   3. Left atrial size was moderately dilated.   4. Right atrial size was moderately dilated.   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 Mild mitral valve regurgitation.   6. Tricuspid valve regurgitation is mild to moderate.   7. The aortic valve is calcified. Aortic valve regurgitation is trivial.  Aortic valve sclerosis/calcification is present, without any evidence of  aortic stenosis.   Patient Profile  61 year old female with history of CAD status post PCI, hypertension, hyperlipidemia admitted on 10/21/2022 with non-STEMI.  Assessment & Plan   #Non-STEMI #Hypertensive crisis -Admitted with chest pain and severely elevated blood pressure.  Known history of small vessel disease on left heart catheterization in 2021.  Also underwent PCI to an OM lesion in 2020. -Chest pain-free.  Troponin elevation has gone down.  Echo shows normal LV function with no wall motion abnormality. -Continue aspirin and heparin.  Plan for left heart catheterization tomorrow.  Risk and benefits explained.   N.p.o. at midnight. -Increase Lipitor to 80 mg daily.  Continue aspirin and heparin drip.  On Coreg 25 mg twice daily. -Also on amlodipine 5 mg daily as well as hydralazine 50 mg 3 times daily.  Blood pressure much improved.  For questions or updates, please contact [place] Please consult [email or web address] for contact info under      Signed, [place] B[place] T. Audie Box, MD, [place]  10/22/2022 11:33 AM

## 2022-10-22 NOTE — Progress Notes (Signed)
Cardiology Progress Note  Patient ID: Donna Prince MRN: [id number] DOB: 04/20/1961 Date of Encounter: 10/22/2022  Primary Cardiologist: None  Subjective   Chief Complaint: None.  HPI: Chest pain.  Plan for left heart catheterization tomorrow.  ROS:  All other ROS reviewed and negative. Pertinent positives noted in the HPI.     Inpatient Medications  Scheduled Meds:  amLODipine  5 mg Oral Daily   aspirin EC  81 mg Oral Daily   atorvastatin  40 mg Oral Daily   carvedilol  25 mg Oral BID WC   hydrALAZINE  50 mg Oral Q8H   Continuous Infusions:  heparin 1,200 Units/hr (10/22/22 0527)   PRN Meds: acetaminophen, ALPRAZolam, hydrALAZINE, HYDROcodone-acetaminophen, morphine injection, nitroGLYCERIN, ondansetron (ZOFRAN) IV, ondansetron **OR** [DISCONTINUED] ondansetron (ZOFRAN) IV, mouth rinse   Vital Signs   Vitals:   10/21/22 1940 10/21/22 2305 10/22/22 0448 10/22/22 0748  BP: 137/74 (!) 140/75 132/77 118/69  Pulse: 70 71 66 81  Resp: 19 19 20 18  Temp: 97.6 F (36.4 C) 97.7 F (36.5 C) 97.8 F (36.6 C) 97.6 F (36.4 C)  TempSrc:    Oral  SpO2: 98% 98% 98% 98%  Weight:      Height:        Intake/Output Summary (Last 24 hours) at 10/22/2022 1133 Last data filed at 10/22/2022 0319 Gross per 24 hour  Intake 570.43 ml  Output --  Net 570.43 ml      10/20/2022    6:04 PM 07/21/2019    8:00 PM  Last 3 Weights  Weight (lbs) 180 lb 216 lb  Weight (kg) 81.647 kg 97.977 kg      Telemetry  Overnight telemetry shows sinus rhythm with PVCs, which I personally reviewed.   ECG  The most recent ECG shows sinus rhythm heart rate 69, no acute ischemic changes or evidence of infarction, which I personally reviewed.   Physical Exam   Vitals:   10/21/22 1940 10/21/22 2305 10/22/22 0448 10/22/22 0748  BP: 137/74 (!) 140/75 132/77 118/69  Pulse: 70 71 66 81  Resp: 19 19 20 18  Temp: 97.6 F (36.4 C) 97.7 F (36.5 C) 97.8 F (36.6 C) 97.6 F (36.4 C)  TempSrc:     Oral  SpO2: 98% 98% 98% 98%  Weight:      Height:        Intake/Output Summary (Last 24 hours) at 10/22/2022 1133 Last data filed at 10/22/2022 0319 Gross per 24 hour  Intake 570.43 ml  Output --  Net 570.43 ml       10/20/2022    6:04 PM 07/21/2019    8:00 PM  Last 3 Weights  Weight (lbs) 180 lb 216 lb  Weight (kg) 81.647 kg 97.977 kg    Body mass index is 29.05 kg/m.  General: Well nourished, well developed, in no acute distress Head: Atraumatic, normal size  Eyes: PEERLA, EOMI  Neck: Supple, no JVD Endocrine: No thryomegaly Cardiac: Normal S1, S2; RRR; no murmurs, rubs, or gallops Lungs: Clear to auscultation bilaterally, no wheezing, rhonchi or rales  Abd: Soft, nontender, no hepatomegaly  Ext: No edema, pulses 2+ Musculoskeletal: No deformities, BUE and BLE strength normal and equal Skin: Warm and dry, no rashes   Neuro: Alert and oriented to person, place, time, and situation, CNII-XII grossly intact, no focal deficits  Psych: Normal mood and affect   Labs  High Sensitivity Troponin:   Recent Labs  Lab 10/20/22 1807 10/20/22 2020 10/21/22 0257 10/21/22 0507   10/22/22 0439  TROPONINIHS 31* 574* 2,154* 1,665* 566*     Cardiac EnzymesNo results for input(s): "TROPONINI" in the last 168 hours. No results for input(s): "TROPIPOC" in the last 168 hours.  Chemistry Recent Labs  Lab 10/20/22 1807 10/22/22 0439  NA 140 138  K 3.9 4.2  CL 108 105  CO2 25 26  GLUCOSE 163* 99  BUN 18 18  CREATININE 1.06* 0.67  CALCIUM 9.2 9.3  PROT  --  6.6  ALBUMIN  --  3.9  AST  --  18  ALT  --  18  ALKPHOS  --  88  BILITOT  --  0.7  GFRNONAA >60 >60  ANIONGAP 7 7    Hematology Recent Labs  Lab 10/20/22 1807 10/21/22 1107 10/22/22 0439  WBC 3.6* 4.9 5.0  RBC 4.84 4.76 4.77  HGB 12.9 12.5 13.0  HCT 38.3 37.1 38.2  MCV 79.1* 77.9* 80.1  MCH 26.7 26.3 27.3  MCHC 33.7 33.7 34.0  RDW 13.2 13.2 13.2  PLT 152 158 163   BNP Recent Labs  Lab 10/21/22 1649  BNP  200.0*    DDimer No results for input(s): "DDIMER" in the last 168 hours.   Radiology  ECHOCARDIOGRAM COMPLETE  Result Date: 10/21/2022    ECHOCARDIOGRAM REPORT   Patient Name:   FALLYNN GRAVETT Date of Exam: 10/21/2022 Medical Rec #:  [id number]       Height:       66.0 in Accession #:    [id number]      Weight:       180.0 lb Date of Birth:  03-18-1961        BSA:          1.912 m Patient Age:    61 years        BP:           136/80 mmHg Patient Gender: F               HR:           74 bpm. Exam Location:  [facility] Procedure: 2D Echo, Cardiac Doppler and Color Doppler Indications:     NSTEMI  History:         Patient has no prior history of Echocardiogram examinations.                  Acute MI and CAD; Risk Factors:Hypertension.  Sonographer:     Wenda Low Referring Phys:  [id number] Athena Masse Diagnosing Phys: [place]  1. Left ventricular ejection fraction, by estimation, is 60 to 65%. The left ventricle has normal function. The left ventricle has no regional wall motion abnormalities. The left ventricular internal cavity size was mildly dilated. There is severe concentric left ventricular hypertrophy. Left ventricular diastolic parameters are consistent with Grade I diastolic dysfunction (impaired relaxation).  2. Right ventricular systolic function is mildly reduced. The right ventricular size is mildly enlarged. Mildly increased right ventricular wall thickness. There is normal pulmonary artery systolic pressure.  3. Left atrial size was moderately dilated.  4. Right atrial size was moderately dilated.  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 Mild mitral valve regurgitation.  6. Tricuspid valve regurgitation is mild to moderate.  7. The aortic valve is calcified. Aortic valve regurgitation is trivial. Aortic valve sclerosis/calcification is present, without any evidence of aortic stenosis. FINDINGS  Left Ventricle: Left ventricular ejection fraction, by estimation, is 60 to 65%. The left  ventricle has normal function. The  left ventricle has no regional wall motion abnormalities. The left ventricular internal cavity size was mildly dilated. There is  severe concentric left ventricular hypertrophy. Left ventricular diastolic parameters are consistent with Grade I diastolic dysfunction (impaired relaxation). Right Ventricle: The right ventricular size is mildly enlarged. Mildly increased right ventricular wall thickness. Right ventricular systolic function is mildly reduced. There is normal pulmonary artery systolic pressure. The tricuspid regurgitant velocity is 2.57 m/s, and with an assumed right atrial pressure of 3 mmHg, the estimated right ventricular systolic pressure is 29.4 mmHg. Left Atrium: Left atrial size was moderately dilated. Right Atrium: Right atrial size was moderately dilated. Pericardium: There is no evidence of pericardial effusion. Mitral Valve: The mitral valve is myxomatous. Mild mitral valve regurgitation. MV peak gradient, 5.0 mmHg. The mean mitral valve gradient is 2.0 mmHg. Tricuspid Valve: The tricuspid valve is grossly normal. Tricuspid valve regurgitation is mild to moderate. Aortic Valve: The aortic valve is calcified. Aortic valve regurgitation is trivial. Aortic valve sclerosis/calcification is present, without any evidence of aortic stenosis. Aortic valve mean gradient measures 5.0 mmHg. Aortic valve peak gradient measures 9.0 mmHg. Aortic valve area, by VTI measures 2.65 cm. Pulmonic Valve: The pulmonic valve was grossly normal. Pulmonic valve regurgitation is trivial. Aorta: The aortic root, ascending aorta and aortic arch are all structurally normal, with no evidence of dilitation or obstruction. IAS/Shunts: No atrial level shunt detected by color flow Doppler.  LEFT VENTRICLE PLAX 2D LVIDd:         4.40 cm   Diastology LVIDs:         3.10 cm   LV e' medial:    5.98 cm/s LV PW:         1.10 cm   LV E/e' medial:  12.2 LV IVS:        1.20 cm   LV e' lateral:   8.16  cm/s LVOT diam:     2.10 cm   LV E/e' lateral: 9.0 LV SV:         94 LV SV Index:   49 LVOT Area:     3.46 cm  RIGHT VENTRICLE RV Basal diam:  3.30 cm RV Mid diam:    2.90 cm RV S prime:     16.20 cm/s TAPSE (M-mode): 2.5 cm LEFT ATRIUM             Index        RIGHT ATRIUM           Index LA diam:        4.20 cm 2.20 cm/m   RA Area:     13.70 cm LA Vol (A2C):   76.8 ml 40.16 ml/m  RA Volume:   32.60 ml  17.05 ml/m LA Vol (A4C):   55.1 ml 28.81 ml/m LA Biplane Vol: 69.7 ml 36.45 ml/m  AORTIC VALVE                     PULMONIC VALVE AV Area (Vmax):    3.07 cm      PV Vmax:       1.15 m/s AV Area (Vmean):   2.70 cm      PV Peak grad:  5.3 mmHg AV Area (VTI):     2.65 cm AV Vmax:           150.00 cm/s AV Vmean:          113.000 cm/s AV VTI:              0.356 m AV Peak Grad:      9.0 mmHg AV Mean Grad:      5.0 mmHg LVOT Vmax:         133.00 cm/s LVOT Vmean:        88.200 cm/s LVOT VTI:          0.272 m LVOT/AV VTI ratio: 0.76  AORTA Ao Root diam: 3.10 cm MITRAL VALVE               TRICUSPID VALVE MV Area (PHT): 2.81 cm    TR Peak grad:   26.4 mmHg MV Area VTI:   2.83 cm    TR Vmax:        257.00 cm/s MV Peak grad:  5.0 mmHg MV Mean grad:  2.0 mmHg    SHUNTS MV Vmax:       1.12 m/s    Systemic VTI:  0.27 m MV Vmean:      65.0 cm/s   Systemic Diam: 2.10 cm MV Decel Time: 270 msec MV E velocity: 73.20 cm/s MV A velocity: 98.20 cm/s MV E/A ratio:  0.75 Shaukat Khan Electronically signed by Shaukat Khan Signature Date/Time: 10/21/2022/2:22:22 PM    Final    CT Angio Chest Aorta W and/or Wo Contrast  Result Date: 10/20/2022 CLINICAL DATA:  Left chest pressure and shortness of breath. EXAM: CT ANGIOGRAPHY CHEST WITH CONTRAST TECHNIQUE: Multidetector CT imaging of the chest was performed using the standard protocol during bolus administration of intravenous contrast. Multiplanar CT image reconstructions and MIPs were obtained to evaluate the vascular anatomy. RADIATION DOSE REDUCTION: This exam was performed  according to the departmental dose-optimization program which includes automated exposure control, adjustment of the mA and/or kV according to patient size and/or use of iterative reconstruction technique. CONTRAST:  100mL OMNIPAQUE IOHEXOL 350 MG/ML SOLN COMPARISON:  November 14, 2007 FINDINGS: Cardiovascular: Satisfactory opacification of the pulmonary arteries to the segmental level. No evidence of pulmonary embolism. Normal heart size. No pericardial effusion. Mediastinum/Nodes: No enlarged mediastinal, hilar, or axillary lymph nodes. Small bilateral thyroid nodules are noted. The trachea and esophagus demonstrate no significant findings. Lungs/Pleura: Lungs are clear. No pleural effusion or pneumothorax. Upper Abdomen: A 13 mm diameter round hypodense lesion is seen within the anterior aspect of the right lobe of the liver. Additional subcentimeter foci of low attenuation are noted within the left lobe of the thyroid gland. Musculoskeletal: No chest wall abnormality. No acute or significant osseous findings. Review of the MIP images confirms the above findings. IMPRESSION: 1. No evidence of pulmonary embolism or other acute intrathoracic process. 2. Small bilateral thyroid nodules. No follow-up imaging is recommended. This follows ACR consensus guidelines: Managing Incidental Thyroid Nodules Detected on Imaging: White Paper of the ACR Incidental Thyroid Findings Committee. J Am Coll Radiol 2015; 12:143-150. 3. Findings which may represent small hepatic cysts and/or hemangiomas. Correlation with nonemergent hepatic ultrasound is recommended. Electronically Signed   By: Thaddeus  Houston M.D.   On: 10/20/2022 21:06   DG Chest 2 View  Result Date: 10/20/2022 CLINICAL DATA:  Left chest pressure and shortness of breath starting today EXAM: CHEST - 2 VIEW COMPARISON:  11/11/2012 FINDINGS: The heart size and mediastinal contours are within normal limits. Both lungs are clear. The visualized skeletal structures  are unremarkable. IMPRESSION: No active cardiopulmonary disease. Electronically Signed   By: Walter  Liebkemann M.D.   On: 10/20/2022 18:29    Cardiac Studies   Left heart cath at [facility] 05/2020 D1 70% OM2 20% (in-stent   restenosis)   Left heart cath at [facility] 01/2019 OM2 99% -> PCI  TTE 10/21/2022  1. Left ventricular ejection fraction, by estimation, is 60 to 65%. The  left ventricle has normal function. The left ventricle has no regional  wall motion abnormalities. The left ventricular internal cavity size was  mildly dilated. There is severe  concentric left ventricular hypertrophy. Left ventricular diastolic  parameters are consistent with Grade I diastolic dysfunction (impaired  relaxation).   2. Right ventricular systolic function is mildly reduced. The right  ventricular size is mildly enlarged. Mildly increased right ventricular  wall thickness. There is normal pulmonary artery systolic pressure.   3. Left atrial size was moderately dilated.   4. Right atrial size was moderately dilated.   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 Mild mitral valve regurgitation.   6. Tricuspid valve regurgitation is mild to moderate.   7. The aortic valve is calcified. Aortic valve regurgitation is trivial.  Aortic valve sclerosis/calcification is present, without any evidence of  aortic stenosis.   Patient Profile  61-year-old female with history of CAD status post PCI, hypertension, hyperlipidemia admitted on 10/21/2022 with non-STEMI.  Assessment & Plan   #Non-STEMI #Hypertensive crisis -Admitted with chest pain and severely elevated blood pressure.  Known history of small vessel disease on left heart catheterization in 2021.  Also underwent PCI to an OM lesion in 2020. -Chest pain-free.  Troponin elevation has gone down.  Echo shows normal LV function with no wall motion abnormality. -Continue aspirin and heparin.  Plan for left heart catheterization tomorrow.  Risk and benefits explained.   N.p.o. at midnight. -Increase Lipitor to 80 mg daily.  Continue aspirin and heparin drip.  On Coreg 25 mg twice daily. -Also on amlodipine 5 mg daily as well as hydralazine 50 mg 3 times daily.  Blood pressure much improved.  For questions or updates, please contact [facility] Please consult [email or web address] for contact info under      Signed,  T. O'Neal, MD, FACC [place]  [facility]  10/22/2022 11:33 AM   

## 2022-10-22 NOTE — Progress Notes (Signed)
ANTICOAGULATION CONSULT NOTE   Pharmacy Consult for IV heparin Indication: chest pain/ACS  Allergies  Allergen Reactions   Lisinopril Swelling   Losartan Swelling   Mangifera Indica    Amoxicillin Hives, Diarrhea and Rash   Poison Oak Extract    Penicillins Hives and Rash    Patient Measurements: Height: 5\' 6"  (167.6 cm) Weight: 81.6 kg (180 lb) IBW/kg (Calculated) : 59.3 Heparin Dosing Weight: 76.4 kg  Vital Signs: Temp: 97.6 F (36.4 C) (10/29 0748) Temp Source: Oral (10/29 0748) BP: 118/69 (10/29 0748) Pulse Rate: 81 (10/29 0748)  Labs: Recent Labs    10/20/22 1807 10/20/22 1807 10/20/22 2020 10/21/22 0257 10/21/22 0507 10/21/22 1107 10/21/22 1649 10/22/22 0439 10/22/22 1144  HGB 12.9  --   --   --   --  12.5  --  13.0  --   HCT 38.3  --   --   --   --  37.1  --  38.2  --   PLT 152  --   --   --   --  158  --  163  --   APTT  --   --   --   --  30  --   --   --   --   LABPROT  --   --   --   --  13.8  --   --   --   --   INR  --   --   --   --  1.1  --   --   --   --   HEPARINUNFRC  --    < >  --   --   --  0.38 0.30 0.20* 0.32  CREATININE 1.06*  --   --   --   --   --   --  0.67  --   TROPONINIHS 31*  --    < > 2,154* 1,665*  --   --  566*  --    < > = values in this interval not displayed.     Estimated Creatinine Clearance: 81.5 mL/min (by C-G formula based on SCr of 0.67 mg/dL).   Medical History: Past Medical History:  Diagnosis Date   Hypertension    MI, acute, non ST segment elevation ([place]) 2020    Medications:  Medications Prior to Admission  Medication Sig Dispense Refill Last Dose   acetaminophen (TYLENOL) 325 MG tablet Take 650 mg by mouth every 6 (six) hours as needed for headache or mild pain.   prn at prn   ibuprofen (ADVIL) 200 MG tablet Take 400 mg by mouth every 6 (six) hours as needed for moderate pain.   prn at prn   doxycycline (VIBRA-TABS) 100 MG tablet Take 1 tablet (100 mg total) by mouth 2 (two) times daily. (Patient not  taking: Reported on 10/20/2022) 20 tablet 0 Completed Course   Scheduled:   amLODipine  5 mg Oral Daily   aspirin EC  81 mg Oral Daily   [START ON 10/23/2022] atorvastatin  80 mg Oral Daily   carvedilol  25 mg Oral BID WC   hydrALAZINE  50 mg Oral Q8H   Infusions:   heparin 1,200 Units/hr (10/22/22 0527)   PRN: acetaminophen, ALPRAZolam, hydrALAZINE, HYDROcodone-acetaminophen, morphine injection, nitroGLYCERIN, ondansetron (ZOFRAN) IV, ondansetron **OR** [DISCONTINUED] ondansetron (ZOFRAN) IV, mouth rinse Anti-infectives (From admission, onward)    None      Not on chronic PTA anticoagulation per my chart review  Assessment: 61  year old female presenting with chest pain and shortness of breath. Troponins elevated. Pharmacy has been consulted to start heparin for ACS.  10/28 11:07 HL 0.38 10/28 1649 HL 0.30 10/29 0509 HL 0.20 10/29 1144 HL 0.32    Goal of Therapy:  Heparin level 0.3-0.7 units/ml Monitor platelets by anticoagulation protocol: Yes   Plan:  Heparin level is therapeutic. Will continue heparin infusion at 1200 units/hr. Recheck heparin level in 6 hours. CBC daily while on heparin. Plan for [facility] Monday.     Ronnald Ramp, PharmD 10/22/2022,12:33 PM

## 2022-10-22 NOTE — TOC Initial Note (Signed)
Transition of Care [facility]) - Initial/Assessment Note    Patient Details  Name: Donna Prince MRN: [id number] Date of Birth: 04-Feb-1961  [facility]) CM/SW Contact:    Candie Chroman, LCSW Phone Number: 10/22/2022, 12:47 PM  Clinical Narrative:  CSW acknowledges consult for PCP needs and medication assistance. Patient unable to get [facility] since she has Medicaid. PCP listed in chart is Curtis Sites, MD. Patient said she goes to a clinic at [facility] as sees the residents there so they rotate every few months or so. Told her [facility] can usually get new patients in within 1-2 weeks. Put their information on her AVS if she is interested in pursuing that. No further concerns. CSW encouraged patient to contact CSW as needed. CSW will continue to follow patient for support and facilitate return home once stable. Her car is here in the parking lot. She can also call her daughter or son if transport needed at discharge.                Expected Discharge Plan: Home/Self Care Barriers to Discharge: Continued Medical Work up   Patient Goals and CMS Choice        Expected Discharge Plan and Services Expected Discharge Plan: Home/Self Care     Post Acute Care Choice: NA Living arrangements for the past 2 months: Single Family Home                                      Prior Living Arrangements/Services Living arrangements for the past 2 months: Single Family Home   Patient language and need for interpreter reviewed:: Yes Do you feel safe going back to the place where you live?: Yes      Need for Family Participation in Patient Care: Yes (Comment)     Criminal Activity/Legal Involvement Pertinent to Current Situation/Hospitalization: No - Comment as needed  Activities of Daily Living Home Assistive Devices/Equipment: None ADL Screening (condition at time of admission) Patient's cognitive ability adequate to safely complete  daily activities?: Yes Is the patient deaf or have difficulty hearing?: No Does the patient have difficulty seeing, even when wearing glasses/contacts?: No Does the patient have difficulty concentrating, remembering, or making decisions?: No Patient able to express need for assistance with ADLs?: Yes Does the patient have difficulty dressing or bathing?: No Independently performs ADLs?: Yes (appropriate for developmental age) Does the patient have difficulty walking or climbing stairs?: No Weakness of Legs: None Weakness of Arms/Hands: None  Permission Sought/Granted                  Emotional Assessment Appearance:: Appears stated age Attitude/Demeanor/Rapport: Engaged, Gracious Affect (typically observed): Accepting, Appropriate, Calm, Pleasant Orientation: : Oriented to Self, Oriented to Place, Oriented to  Time, Oriented to Situation Alcohol / Substance Use: Not Applicable Psych Involvement: No (comment)  Admission diagnosis:  Thyroid nodule [E04.1] Hypertensive emergency without congestive heart failure [I16.1] Abnormal CT of liver [R93.2] Hypertensive emergency [I16.1] Chest pain with high risk for cardiac etiology [R07.9] NSTEMI (non-ST elevated myocardial infarction) [facility]) [I21.4] Patient Active Problem List   Diagnosis Date Noted   Overweight (BMI 25.0-29.9) 10/21/2022   Chronic diastolic CHF (congestive heart failure) ([place]) 10/21/2022   Abnormal TSH 10/21/2022   Hypertensive emergency 10/20/2022   NSTEMI (non-ST elevated myocardial infarction) ([place]) 10/20/2022   CAD S/P percutaneous coronary angioplasty 10/20/2022   Noncompliance,  with medication, CPAP and follow-up 10/20/2022   Obstructive sleep apnea 06/17/2017   PCP:  Tanna Furry, MD Pharmacy:   [facility] [address] [place] - [address] [place] [address] Phone: [phone] Fax: [phone]  [id number] [place], [place] - [address] AT  [facility] [address] [place] [address] Phone: [phone] Fax: [phone]     Social Determinants of Health (SDOH) Interventions    Readmission Risk Interventions     No data to display

## 2022-10-22 NOTE — Progress Notes (Signed)
ANTICOAGULATION CONSULT NOTE   Pharmacy Consult for IV heparin Indication: chest pain/ACS  Allergies  Allergen Reactions   Lisinopril Swelling   Losartan Swelling   Mangifera Indica    Amoxicillin Hives, Diarrhea and Rash   Poison Oak Extract    Penicillins Hives and Rash    Patient Measurements: Height: 5\' 6"  (167.6 cm) Weight: 81.6 kg (180 lb) IBW/kg (Calculated) : 59.3 Heparin Dosing Weight: 76.4 kg  Vital Signs: Temp: 97.8 F (36.6 C) (10/29 0448) BP: 132/77 (10/29 0448) Pulse Rate: 66 (10/29 0448)  Labs: Recent Labs    10/20/22 1807 10/20/22 2020 10/21/22 0257 10/21/22 0507 10/21/22 1107 10/21/22 1649 10/22/22 0439  HGB 12.9  --   --   --  12.5  --  13.0  HCT 38.3  --   --   --  37.1  --  38.2  PLT 152  --   --   --  158  --  163  APTT  --   --   --  30  --   --   --   LABPROT  --   --   --  13.8  --   --   --   INR  --   --   --  1.1  --   --   --   HEPARINUNFRC  --   --   --   --  0.38 0.30 0.20*  CREATININE 1.06*  --   --   --   --   --   --   TROPONINIHS 31* 574* 2,154* 1,665*  --   --   --      Estimated Creatinine Clearance: 61.5 mL/min (A) (by C-G formula based on SCr of 1.06 mg/dL (H)).   Medical History: Past Medical History:  Diagnosis Date   Hypertension    MI, acute, non ST segment elevation ([place]) 2020    Medications:  Medications Prior to Admission  Medication Sig Dispense Refill Last Dose   acetaminophen (TYLENOL) 325 MG tablet Take 650 mg by mouth every 6 (six) hours as needed for headache or mild pain.   prn at prn   ibuprofen (ADVIL) 200 MG tablet Take 400 mg by mouth every 6 (six) hours as needed for moderate pain.   prn at prn   doxycycline (VIBRA-TABS) 100 MG tablet Take 1 tablet (100 mg total) by mouth 2 (two) times daily. (Patient not taking: Reported on 10/20/2022) 20 tablet 0 Completed Course   Scheduled:   amLODipine  5 mg Oral Daily   aspirin EC  81 mg Oral Daily   atorvastatin  40 mg Oral Daily   carvedilol  25 mg  Oral BID WC   hydrALAZINE  50 mg Oral Q8H   Infusions:   heparin 950 Units/hr (10/22/22 0458)   PRN: acetaminophen, ALPRAZolam, hydrALAZINE, HYDROcodone-acetaminophen, morphine injection, nitroGLYCERIN, ondansetron (ZOFRAN) IV, ondansetron **OR** [DISCONTINUED] ondansetron (ZOFRAN) IV, mouth rinse Anti-infectives (From admission, onward)    None      Not on chronic PTA anticoagulation per my chart review  Assessment: 61 year old female presenting with chest pain and shortness of breath. Troponins elevated. Pharmacy has been consulted to start heparin for ACS.  10/28 11:07 HL 0.38 10/28 1649 HL 0.30 10/29 0509 HL 0.20   Goal of Therapy:  Heparin level 0.3-0.7 units/ml Monitor platelets by anticoagulation protocol: Yes   Plan: Heparin level is subtherapeutic Heparin bolus 2200 units Increase heparin to 1200 units/hr Next HL in 6 hours CBC  daily while on heparin. Plan for [facility] Monday.    Jaynie Bream, PharmD 10/22/2022,5:12 AM

## 2022-10-22 NOTE — Progress Notes (Signed)
  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  Initial troponin at 31, but subsequent sets continue to trend upwards, peaking at 2100 within 9 hours of presentation to the emergency room.  Patient also found to have markedly elevated blood pressures with systolic in the 419F.  Patient admitted to the hospitalist service for hypertensive emergency and non-STEMI.  Cardiology consulted.  Assessment and Plan: * Hypertensive emergency Patient noncompliant with BP meds with BP 181/107 on arrival  Improved with labetalol 10 mg x 1 dose in the ED Restart prior antihypertensives  Lopressor 12.5 mg twice daily.   Will hold prior losartan as patient reports possible mouth swelling(will update allergy list).  Appreciate cardiology help.  --bp much better w/o any side effects  NSTEMI (non-ST elevated myocardial infarction) ([place]) CAD with history of PCI 2020 and 2021 for NSTEMI.  Troponin maxed around 2100.  Has been trending downward.   Continue aspirin, nitroglycerin and statin.  Possible this could be demand ischemia.   Left heart catheterization planned for 10/30 by [facility] cardiology  Chronic diastolic CHF (congestive heart failure) (HCC) Echocardiogram notes grade 1 diastolic dysfunction.    Obstructive sleep apnea Not currently on CPAP  Abnormal TSH Patient appears to hyperthyroidism.  TSH quite low, checking free T3 and free T4  Noncompliance, with medication, CPAP and follow-up Counseling provided regarding risk to overall prognosis TOC consult  Overweight (BMI  25.0-29.9) Meets criteria for BMI greater than 25        Subjective: no complaints today  Physical Exam: Vitals:   10/22/22 0448 10/22/22 0748 10/22/22 1305 10/22/22 1308  BP: 132/77 118/69 99/66 (!) 123/59  Pulse: 66 81 68 70  Resp: 20 18 18 18   Temp: 97.8 F (36.6 C) 97.6 F (36.4 C)  97.7 F (36.5 C)  TempSrc:  Oral  Oral  SpO2: 98% 98% 97% 98%  Weight:      Height:      Family Communication: none  Disposition: Status is: Inpatient Remains inpatient appropriate because: [facility] 10/30  Planned Discharge Destination: Home    Time spent: 35 minutes  Author: Fritzi Mandes, MD 10/22/2022 2:53 PM  For on call review [email or web address].

## 2022-10-23 ENCOUNTER — Telehealth: Payer: Self-pay | Admitting: Cardiovascular Disease

## 2022-10-23 ENCOUNTER — Encounter
Admission: EM | Disposition: A | Discharge status: Home / Self Care | Payer: Self-pay | Source: Home / Self Care | Attending: Internal Medicine

## 2022-10-23 DIAGNOSIS — E663 Overweight: Secondary | ICD-10-CM | POA: Diagnosis not present

## 2022-10-23 DIAGNOSIS — R778 Other specified abnormalities of plasma proteins: Secondary | ICD-10-CM

## 2022-10-23 DIAGNOSIS — I161 Hypertensive emergency: Secondary | ICD-10-CM | POA: Diagnosis not present

## 2022-10-23 DIAGNOSIS — I10 Essential (primary) hypertension: Secondary | ICD-10-CM

## 2022-10-23 DIAGNOSIS — E785 Hyperlipidemia, unspecified: Secondary | ICD-10-CM

## 2022-10-23 DIAGNOSIS — R7989 Other specified abnormal findings of blood chemistry: Secondary | ICD-10-CM | POA: Diagnosis not present

## 2022-10-23 HISTORY — PX: LEFT HEART CATH AND CORONARY ANGIOGRAPHY: CATH118249

## 2022-10-23 LAB — HEPARIN LEVEL (UNFRACTIONATED): Heparin Unfractionated: 0.31 IU/mL (ref 0.30–0.70)

## 2022-10-23 LAB — CBC
HCT: 37 % (ref 36.0–46.0)
Hemoglobin: 12.4 g/dL (ref 12.0–15.0)
MCH: 27.1 pg (ref 26.0–34.0)
MCHC: 33.5 g/dL (ref 30.0–36.0)
MCV: 80.8 fL (ref 80.0–100.0)
Platelets: 172 10*3/uL (ref 150–400)
RBC: 4.58 MIL/uL (ref 3.87–5.11)
RDW: 13.5 % (ref 11.5–15.5)
WBC: 4.5 10*3/uL (ref 4.0–10.5)
nRBC: 0 % (ref 0.0–0.2)

## 2022-10-23 SURGERY — LEFT HEART CATH AND CORONARY ANGIOGRAPHY
Anesthesia: Moderate Sedation

## 2022-10-23 MED ORDER — ASPIRIN 81 MG PO CHEW: 81.0000 mg | CHEWABLE_TABLET | ORAL | Status: DC

## 2022-10-23 MED ORDER — FENTANYL CITRATE (PF) 100 MCG/2ML IJ SOLN
INTRAMUSCULAR | Status: DC | PRN
  Administered 2022-10-23: 25 ug via INTRAVENOUS

## 2022-10-23 MED ORDER — SODIUM CHLORIDE 0.9 % IV SOLN: 250.0000 mL | INTRAVENOUS | Status: DC | PRN

## 2022-10-23 MED ORDER — HEPARIN (PORCINE) IN NACL 1000-0.9 UT/500ML-% IV SOLN
INTRAVENOUS | Status: DC | PRN
  Administered 2022-10-23: 1000 mL

## 2022-10-23 MED ORDER — LIDOCAINE HCL 1 % IJ SOLN
INTRAMUSCULAR | Status: AC
  Filled 2022-10-23: qty 20

## 2022-10-23 MED ORDER — IOHEXOL 300 MG/ML  SOLN
INTRAMUSCULAR | Status: DC | PRN
  Administered 2022-10-23: 62 mL

## 2022-10-23 MED ORDER — SODIUM CHLORIDE 0.9% FLUSH: 3.0000 mL | Freq: Two times a day (BID) | INTRAVENOUS | Status: DC

## 2022-10-23 MED ORDER — LIDOCAINE HCL (PF) 1 % IJ SOLN
INTRAMUSCULAR | Status: DC | PRN
  Administered 2022-10-23: 2 mL

## 2022-10-23 MED ORDER — AMLODIPINE BESYLATE 5 MG PO TABS: 5.0000 mg | ORAL_TABLET | Freq: Every day | ORAL | 11 refills | Status: DC

## 2022-10-23 MED ORDER — HEPARIN (PORCINE) IN NACL 1000-0.9 UT/500ML-% IV SOLN
INTRAVENOUS | Status: AC
  Filled 2022-10-23: qty 1000

## 2022-10-23 MED ORDER — SODIUM CHLORIDE 0.9% FLUSH
3.0000 mL | Freq: Two times a day (BID) | INTRAVENOUS | Status: DC
  Administered 2022-10-23: 3 mL via INTRAVENOUS

## 2022-10-23 MED ORDER — HYDRALAZINE HCL 50 MG PO TABS: 50.0000 mg | ORAL_TABLET | Freq: Three times a day (TID) | ORAL | 11 refills | Status: DC

## 2022-10-23 MED ORDER — SODIUM CHLORIDE 0.9 % WEIGHT BASED INFUSION
3.0000 mL/kg/h | INTRAVENOUS | Status: DC
  Administered 2022-10-23: 3 mL/kg/h via INTRAVENOUS

## 2022-10-23 MED ORDER — MIDAZOLAM HCL 2 MG/2ML IJ SOLN
INTRAMUSCULAR | Status: AC
  Filled 2022-10-23: qty 2

## 2022-10-23 MED ORDER — HEPARIN SODIUM (PORCINE) 1000 UNIT/ML IJ SOLN
INTRAMUSCULAR | Status: DC | PRN
  Administered 2022-10-23: 4000 [IU] via INTRAVENOUS

## 2022-10-23 MED ORDER — SODIUM CHLORIDE 0.9% FLUSH: 3.0000 mL | INTRAVENOUS | Status: DC | PRN

## 2022-10-23 MED ORDER — SODIUM CHLORIDE 0.9 % IV SOLN: INTRAVENOUS | Status: AC

## 2022-10-23 MED ORDER — ASPIRIN 81 MG PO CHEW
CHEWABLE_TABLET | ORAL | Status: AC
  Filled 2022-10-23: qty 1

## 2022-10-23 MED ORDER — VERAPAMIL HCL 2.5 MG/ML IV SOLN
INTRAVENOUS | Status: AC
  Filled 2022-10-23: qty 2

## 2022-10-23 MED ORDER — ASPIRIN 81 MG PO TBEC: 81.0000 mg | DELAYED_RELEASE_TABLET | Freq: Every day | ORAL | 12 refills | Status: DC

## 2022-10-23 MED ORDER — FENTANYL CITRATE (PF) 100 MCG/2ML IJ SOLN
INTRAMUSCULAR | Status: AC
  Filled 2022-10-23: qty 2

## 2022-10-23 MED ORDER — VERAPAMIL HCL 2.5 MG/ML IV SOLN
INTRAVENOUS | Status: DC | PRN
  Administered 2022-10-23: 2.5 mg via INTRA_ARTERIAL

## 2022-10-23 MED ORDER — ALBUTEROL SULFATE (2.5 MG/3ML) 0.083% IN NEBU
2.5000 mg | INHALATION_SOLUTION | Freq: Four times a day (QID) | RESPIRATORY_TRACT | Status: DC | PRN
  Administered 2022-10-23: 2.5 mg via RESPIRATORY_TRACT
  Filled 2022-10-23: qty 3

## 2022-10-23 MED ORDER — SODIUM CHLORIDE 0.9 % WEIGHT BASED INFUSION: 1.0000 mL/kg/h | INTRAVENOUS | Status: DC

## 2022-10-23 MED ORDER — CARVEDILOL 25 MG PO TABS: 25.0000 mg | ORAL_TABLET | Freq: Two times a day (BID) | ORAL | 11 refills | Status: DC

## 2022-10-23 MED ORDER — MIDAZOLAM HCL 2 MG/2ML IJ SOLN
INTRAMUSCULAR | Status: DC | PRN
  Administered 2022-10-23: 1 mg via INTRAVENOUS

## 2022-10-23 MED ORDER — HEPARIN SODIUM (PORCINE) 1000 UNIT/ML IJ SOLN
INTRAMUSCULAR | Status: AC
  Filled 2022-10-23: qty 10

## 2022-10-23 MED ORDER — ATORVASTATIN CALCIUM 80 MG PO TABS: 80.0000 mg | ORAL_TABLET | Freq: Every day | ORAL | 1 refills | Status: DC

## 2022-10-23 MED ORDER — ALBUTEROL SULFATE HFA 108 (90 BASE) MCG/ACT IN AERS: 2.0000 | INHALATION_SPRAY | Freq: Four times a day (QID) | RESPIRATORY_TRACT | 2 refills | Status: DC | PRN

## 2022-10-23 SURGICAL SUPPLY — 13 items
BAND ZEPHYR COMPRESS 30 LONG (HEMOSTASIS) IMPLANT
CATH INFINITI 5FR JK (CATHETERS) IMPLANT
CATH JL3.5 FR DIAG (CATHETERS) IMPLANT
DRAPE BRACHIAL (DRAPES) IMPLANT
GLIDESHEATH SLEND SS 6F .021 (SHEATH) IMPLANT
GUIDEWIRE INQWIRE 1.5J.035X260 (WIRE) IMPLANT
INQWIRE 1.5J .035X260CM (WIRE) ×1
KIT SYRINGE INJ CVI SPIKEX1 (MISCELLANEOUS) IMPLANT
PACK CARDIAC CATH (CUSTOM PROCEDURE TRAY) ×1 IMPLANT
PROTECTION STATION PRESSURIZED (MISCELLANEOUS) ×1
SET ATX SIMPLICITY (MISCELLANEOUS) IMPLANT
STATION PROTECTION PRESSURIZED (MISCELLANEOUS) IMPLANT
WIRE GUIDERIGHT .035X150 (WIRE) IMPLANT

## 2022-10-23 NOTE — Progress Notes (Signed)
Patient back to room in NAD, VS stable and cath site level 0.

## 2022-10-23 NOTE — Progress Notes (Signed)
ANTICOAGULATION CONSULT NOTE   Pharmacy Consult for IV heparin Indication: chest pain/ACS  Allergies  Allergen Reactions   Lisinopril Swelling   Losartan Swelling   Mangifera Indica    Amoxicillin Hives, Diarrhea and Rash   Poison Oak Extract    Penicillins Hives and Rash    Patient Measurements: Height: 5\' 6"  (167.6 cm) Weight: 81.6 kg (180 lb) IBW/kg (Calculated) : 59.3 Heparin Dosing Weight: 76.4 kg  Vital Signs: Temp: 98.1 F (36.7 C) (10/30 0338) BP: 111/71 (10/30 0338) Pulse Rate: 74 (10/30 0338)  Labs: Recent Labs    10/20/22 1807 10/20/22 2020 10/21/22 0257 10/21/22 0507 10/21/22 1107 10/21/22 1649 10/22/22 0439 10/22/22 1144 10/22/22 1809 10/23/22 0508  HGB 12.9  --   --   --  12.5  --  13.0  --   --  12.4  HCT 38.3  --   --   --  37.1  --  38.2  --   --  37.0  PLT 152  --   --   --  158  --  163  --   --  172  APTT  --   --   --  30  --   --   --   --   --   --   LABPROT  --   --   --  13.8  --   --   --   --   --   --   INR  --   --   --  1.1  --   --   --   --   --   --   HEPARINUNFRC  --   --   --   --  0.38   < > 0.20* 0.32 0.35 0.31  CREATININE 1.06*  --   --   --   --   --  0.67  --   --   --   TROPONINIHS 31*   < > 2,154* 1,665*  --   --  566*  --   --   --    < > = values in this interval not displayed.     Estimated Creatinine Clearance: 81.5 mL/min (by C-G formula based on SCr of 0.67 mg/dL).   Medical History: Past Medical History:  Diagnosis Date   Hypertension    MI, acute, non ST segment elevation (HCC) 2020    Medications:  Medications Prior to Admission  Medication Sig Dispense Refill Last Dose   acetaminophen (TYLENOL) 325 MG tablet Take 650 mg by mouth every 6 (six) hours as needed for headache or mild pain.   prn at prn   ibuprofen (ADVIL) 200 MG tablet Take 400 mg by mouth every 6 (six) hours as needed for moderate pain.   prn at prn   doxycycline (VIBRA-TABS) 100 MG tablet Take 1 tablet (100 mg total) by mouth 2 (two)  times daily. (Patient not taking: Reported on 10/20/2022) 20 tablet 0 Completed Course   Scheduled:   amLODipine  5 mg Oral Daily   aspirin EC  81 mg Oral Daily   atorvastatin  80 mg Oral Daily   carvedilol  25 mg Oral BID WC   fluticasone  1 spray Each Nare Daily   hydrALAZINE  50 mg Oral Q8H   Infusions:   heparin 1,200 Units/hr (10/22/22 2307)   PRN: acetaminophen, ALPRAZolam, hydrALAZINE, HYDROcodone-acetaminophen, morphine injection, nitroGLYCERIN, ondansetron (ZOFRAN) IV, ondansetron **OR** [DISCONTINUED] ondansetron (ZOFRAN) IV, mouth rinse Anti-infectives (From admission,  onward)    None      Not on chronic PTA anticoagulation per my chart review  Assessment: 61 year old female presenting with chest pain and shortness of breath. Troponins elevated. Pharmacy has been consulted to start heparin for ACS.  10/28 11:07 HL 0.38 10/28 1649 HL 0.30 10/29 0509 HL 0.20 10/29 1144 HL 0.32  10/29 1809 HL 0.35   Goal of Therapy:  Heparin level 0.3-0.7 units/ml Monitor platelets by anticoagulation protocol: Yes   Plan:  Heparin level is therapeutic. Will continue heparin infusion at 1200 units/hr. Check heparin level with am labs. CBC daily while on heparin. Plan for [facility] Monday.     Wynelle Cleveland, PharmD 10/23/2022,7:07 AM

## 2022-10-23 NOTE — Progress Notes (Signed)
Discharge instructions reviewed and all questions answered. Copy of instructions given to patient and prescriptions sent to pharmacy.

## 2022-10-23 NOTE — Progress Notes (Signed)
Patient discharged home via wheelchair with daughter.  

## 2022-10-23 NOTE — Discharge Summary (Signed)
Physician Discharge Summary   Patient: Donna Prince MRN: [id number] DOB: November 19, 1961  Admit date:     10/20/2022  Discharge date: 10/23/22  Discharge Physician: Annita Brod   PCP: Alfonse Flavors, MD   Recommendations at discharge:   New medication: Albuterol inhaler 2 puffs 4 times a day as needed New medication: Norvasc 5 p.o. daily New medication: Aspirin 81 mg p.o. daily New medication: Lipitor 80 mg p.o. daily New medication: Coreg 25 mg p.o. twice daily New medication: Hydralazine 50 mg every 8 hours Patient will follow-up with cardiology in the next month Patient given information to establish with new PCP.  At time of follow-up PCP visit, patient should have repeat thyroid function studies done.  Discharge Diagnoses: Principal Problem:   Hypertensive emergency Active Problems:   Elevated troponin   Chronic diastolic CHF (congestive heart failure) (HCC)   Obstructive sleep apnea   Abnormal TSH   CAD S/P percutaneous coronary angioplasty   Noncompliance, with medication, CPAP and follow-up   Overweight (BMI 25.0-29.9)  Resolved Problems:   * No resolved hospital problems. 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  Initial troponin at 31, but subsequent sets continue to trend upwards, peaking at 2100 within 9 hours of presentation to the emergency room.  Patient also found to have markedly elevated blood pressures with systolic in the [id number].  Patient admitted to the hospitalist service for hypertensive emergency and possible non-STEMI.  Cardiology consulted.  Patient taken for cardiac catheterization on 10/30 with patent OM stent and mild nonobstructive coronary artery disease.  Elevated  troponin felt to be due to supply demand mismatch in setting of uncontrolled hypertension and not felt to be non-STEMI.  Patient okay for discharge after  Assessment and Plan: * Hypertensive emergency Patient noncompliant with BP meds with BP 181/107 on arrival  Improved with labetalol 10 mg x 1 dose in the ED.  Patient placed on regimen of Norvasc, Coreg, and hydralazine and given prescriptions for  Elevated troponin CAD with history of PCI 2020 and 2021 for NSTEMI.  Troponin maxed around 2100.  Has been trending downward.   Continue aspirin, nitroglycerin and statin.  Left heart catheterization done on 10/30 by [facility] cardiology.  Patient found to have patent OM stent and minimal nonobstructive disease.  Elevated troponins felt to be secondary demand mismatch from uncontrolled hypertension.  Recommendation is for medical compliance.  Chronic diastolic CHF (congestive heart failure) (HCC) Echocardiogram notes grade 1 diastolic dysfunction.  Looks to be euvolemic.  Obstructive sleep apnea Not currently on CPAP.  Recommendation for follow-up  Abnormal TSH Patient noted to have TSH of 0.034 and mildly elevated free T4 of 1.17.  Possible this could be in the setting of uncontrolled hypertension and so recommending checking repeat thyroid function studies in 1 month as outpatient follow-up rather than say right now if this is hyperthyroidism.  Noncompliance, with medication, CPAP and follow-up Counseling provided regarding risk to overall prognosis TOC consult  Overweight (BMI 25.0-29.9) Meets criteria for BMI greater than 25         Consultants: Cardiology Procedures performed: Cardiac catheterization done 10/30 Echocardiogram noting grade 1 diastolic dysfunction Disposition: Home Diet recommendation:  Discharge Diet Orders (From admission, onward)     Start  Ordered   10/23/22 0000  Diet - low sodium heart healthy        10/23/22 1351           Cardiac diet DISCHARGE  MEDICATION: Allergies as of 10/23/2022       Reactions   Lisinopril Swelling   Losartan Swelling   Mangifera Indica    Amoxicillin Hives, Diarrhea, Rash   Poison Oak Extract    Penicillins Hives, Rash        Medication List     TAKE these medications    acetaminophen 325 MG tablet Commonly known as: TYLENOL Take 650 mg by mouth every 6 (six) hours as needed for headache or mild pain.   albuterol 108 (90 Base) MCG/ACT inhaler Commonly known as: VENTOLIN HFA Inhale 2 puffs into the lungs every 6 (six) hours as needed for wheezing or shortness of breath.   amLODipine 5 MG tablet Commonly known as: NORVASC Take 1 tablet (5 mg total) by mouth daily. Start taking on: October 24, 2022   aspirin EC 81 MG tablet Take 1 tablet (81 mg total) by mouth daily. Swallow whole. Start taking on: October 24, 2022   atorvastatin 80 MG tablet Commonly known as: LIPITOR Take 1 tablet (80 mg total) by mouth daily. Start taking on: October 24, 2022   carvedilol 25 MG tablet Commonly known as: COREG Take 1 tablet (25 mg total) by mouth 2 (two) times daily with a meal.   hydrALAZINE 50 MG tablet Commonly known as: APRESOLINE Take 1 tablet (50 mg total) by mouth every 8 (eight) hours.   ibuprofen 200 MG tablet Commonly known as: ADVIL Take 400 mg by mouth every 6 (six) hours as needed for moderate pain.        Discharge Exam: Filed Weights   10/20/22 1804 10/23/22 0900  Weight: 81.6 kg 86.2 kg   General: Alert and oriented x3, no acute distress Cardiovascular: Regular rate and rhythm, S1-S2  Condition at discharge: good  The results of significant diagnostics from this hospitalization (including imaging, microbiology, ancillary and laboratory) are listed below for reference.   Imaging Studies: CARDIAC CATHETERIZATION  Result Date: 10/23/2022   Mid Cx lesion is 20% stenosed.   Previously placed 1st Mrg stent of unknown type is  widely patent.   The left ventricular  systolic function is normal.   LV end diastolic pressure is mildly elevated.   The left ventricular ejection fraction is 55-65% by visual estimate. 1.  Widely patent OM stent with no significant restenosis.  Tortuous coronary arteries with mild nonobstructive coronary artery disease. 2.  Normal LV systolic function.  Mildly elevated left ventricular end-diastolic pressure. Recommendations: Elevated troponin is likely due to supply demand mismatch in the setting of uncontrolled hypertension. Recommend continuing medical therapy.   ECHOCARDIOGRAM COMPLETE  Result Date: 10/21/2022    ECHOCARDIOGRAM REPORT   Patient Name:   FELESHIA EARLE Date of Exam: 10/21/2022 Medical Rec #:  [id number]       Height:       66.0 in Accession #:    [id number]      Weight:       180.0 lb Date of Birth:  September 29, 1961        BSA:          1.912 m Patient Age:    61 years        BP:           136/80 mmHg Patient Gender: F  HR:           74 bpm. Exam Location:  [facility] Procedure: 2D Echo, Cardiac Doppler and Color Doppler Indications:     NSTEMI  History:         Patient has no prior history of Echocardiogram examinations.                  Acute MI and CAD; Risk Factors:Hypertension.  Sonographer:     Wenda Low Referring Phys:  [id number] Athena Masse Diagnosing Phys: [place]  1. Left ventricular ejection fraction, by estimation, is 60 to 65%. The left ventricle has normal function. The left ventricle has no regional wall motion abnormalities. The left ventricular internal cavity size was mildly dilated. There is severe concentric left ventricular hypertrophy. Left ventricular diastolic parameters are consistent with Grade I diastolic dysfunction (impaired relaxation).  2. Right ventricular systolic function is mildly reduced. The right ventricular size is mildly enlarged. Mildly increased right ventricular wall thickness. There is normal pulmonary artery systolic pressure.  3. Left atrial size was  moderately dilated.  4. Right atrial size was moderately dilated.  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 Mild mitral valve regurgitation.  6. Tricuspid valve regurgitation is mild to moderate.  7. The aortic valve is calcified. Aortic valve regurgitation is trivial. Aortic valve sclerosis/calcification is present, without any evidence of aortic stenosis. FINDINGS  Left Ventricle: Left ventricular ejection fraction, by estimation, is 60 to 65%. The left ventricle has normal function. The left ventricle has no regional wall motion abnormalities. The left ventricular internal cavity size was mildly dilated. There is  severe concentric left ventricular hypertrophy. Left ventricular diastolic parameters are consistent with Grade I diastolic dysfunction (impaired relaxation). Right Ventricle: The right ventricular size is mildly enlarged. Mildly increased right ventricular wall thickness. Right ventricular systolic function is mildly reduced. There is normal pulmonary artery systolic pressure. The tricuspid regurgitant velocity is 2.57 m/s, and with an assumed right atrial pressure of 3 mmHg, the estimated right ventricular systolic pressure is 37.1 mmHg. Left Atrium: Left atrial size was moderately dilated. Right Atrium: Right atrial size was moderately dilated. Pericardium: There is no evidence of pericardial effusion. Mitral Valve: The mitral valve is myxomatous. Mild mitral valve regurgitation. MV peak gradient, 5.0 mmHg. The mean mitral valve gradient is 2.0 mmHg. Tricuspid Valve: The tricuspid valve is grossly normal. Tricuspid valve regurgitation is mild to moderate. Aortic Valve: The aortic valve is calcified. Aortic valve regurgitation is trivial. Aortic valve sclerosis/calcification is present, without any evidence of aortic stenosis. Aortic valve mean gradient measures 5.0 mmHg. Aortic valve peak gradient measures 9.0 mmHg. Aortic valve area, by VTI measures 2.65 cm. Pulmonic Valve: The pulmonic valve was grossly  normal. Pulmonic valve regurgitation is trivial. Aorta: The aortic root, ascending aorta and aortic arch are all structurally normal, with no evidence of dilitation or obstruction. IAS/Shunts: No atrial level shunt detected by color flow Doppler.  LEFT VENTRICLE PLAX 2D LVIDd:         4.40 cm   Diastology LVIDs:         3.10 cm   LV e' medial:    5.98 cm/s LV PW:         1.10 cm   LV E/e' medial:  12.2 LV IVS:        1.20 cm   LV e' lateral:   8.16 cm/s LVOT diam:     2.10 cm   LV E/e' lateral: 9.0 LV SV:  94 LV SV Index:   49 LVOT Area:     3.46 cm  RIGHT VENTRICLE RV Basal diam:  3.30 cm RV Mid diam:    2.90 cm RV S prime:     16.20 cm/s TAPSE (M-mode): 2.5 cm LEFT ATRIUM             Index        RIGHT ATRIUM           Index LA diam:        4.20 cm 2.20 cm/m   RA Area:     13.70 cm LA Vol (A2C):   76.8 ml 40.16 ml/m  RA Volume:   32.60 ml  17.05 ml/m LA Vol (A4C):   55.1 ml 28.81 ml/m LA Biplane Vol: 69.7 ml 36.45 ml/m  AORTIC VALVE                     PULMONIC VALVE AV Area (Vmax):    3.07 cm      PV Vmax:       1.15 m/s AV Area (Vmean):   2.70 cm      PV Peak grad:  5.3 mmHg AV Area (VTI):     2.65 cm AV Vmax:           150.00 cm/s AV Vmean:          113.000 cm/s AV VTI:            0.356 m AV Peak Grad:      9.0 mmHg AV Mean Grad:      5.0 mmHg LVOT Vmax:         133.00 cm/s LVOT Vmean:        88.200 cm/s LVOT VTI:          0.272 m LVOT/AV VTI ratio: 0.76  AORTA Ao Root diam: 3.10 cm MITRAL VALVE               TRICUSPID VALVE MV Area (PHT): 2.81 cm    TR Peak grad:   26.4 mmHg MV Area VTI:   2.83 cm    TR Vmax:        257.00 cm/s MV Peak grad:  5.0 mmHg MV Mean grad:  2.0 mmHg    SHUNTS MV Vmax:       1.12 m/s    Systemic VTI:  0.27 m MV Vmean:      65.0 cm/s   Systemic Diam: 2.10 cm MV Decel Time: 270 msec MV E velocity: 73.20 cm/s MV A velocity: 98.20 cm/s MV E/A ratio:  0.75 Shaukat Khan Electronically signed by Adrian Blackwater Signature Date/Time: 10/21/2022/2:22:22 PM    Final    CT Angio  Chest Aorta W and/or Wo Contrast  Result Date: 10/20/2022 CLINICAL DATA:  Left chest pressure and shortness of breath. EXAM: CT ANGIOGRAPHY CHEST WITH CONTRAST TECHNIQUE: Multidetector CT imaging of the chest was performed using the standard protocol during bolus administration of intravenous contrast. Multiplanar CT image reconstructions and MIPs were obtained to evaluate the vascular anatomy. RADIATION DOSE REDUCTION: This exam was performed according to the departmental dose-optimization program which includes automated exposure control, adjustment of the mA and/or kV according to patient size and/or use of iterative reconstruction technique. CONTRAST:  OMNIPAQUE IOHEXOL 350 MG/ML SOLN COMPARISON:  November 14, 2007 FINDINGS: Cardiovascular: Satisfactory opacification of the pulmonary arteries to the segmental level. No evidence of pulmonary embolism. Normal heart size. No pericardial effusion. Mediastinum/Nodes: No enlarged mediastinal, hilar, or  axillary lymph nodes. Small bilateral thyroid nodules are noted. The trachea and esophagus demonstrate no significant findings. Lungs/Pleura: Lungs are clear. No pleural effusion or pneumothorax. Upper Abdomen: A 13 mm diameter round hypodense lesion is seen within the anterior aspect of the right lobe of the liver. Additional subcentimeter foci of low attenuation are noted within the left lobe of the thyroid gland. Musculoskeletal: No chest wall abnormality. No acute or significant osseous findings. Review of the MIP images confirms the above findings. IMPRESSION: 1. No evidence of pulmonary embolism or other acute intrathoracic process. 2. Small bilateral thyroid nodules. No follow-up imaging is recommended. This follows ACR consensus guidelines: Managing Incidental Thyroid Nodules Detected on Imaging: White Paper of the ACR Incidental Thyroid Findings Committee. J Am Coll Radiol 2015; 12:143-150. 3. Findings which may represent small hepatic cysts and/or  hemangiomas. Correlation with nonemergent hepatic ultrasound is recommended. Electronically Signed   By: Virgina Norfolk M.D.   On: 10/20/2022 21:06   DG Chest 2 View  Result Date: 10/20/2022 CLINICAL DATA:  Left chest pressure and shortness of breath starting today EXAM: CHEST - 2 VIEW COMPARISON:  11/11/2012 FINDINGS: The heart size and mediastinal contours are within normal limits. Both lungs are clear. The visualized skeletal structures are unremarkable. IMPRESSION: No active cardiopulmonary disease. Electronically Signed   By: Van Clines M.D.   On: 10/20/2022 18:29    Microbiology: No results found for this or any previous visit.  Labs: CBC: Recent Labs  Lab 10/20/22 1807 10/21/22 1107 10/22/22 0439 10/23/22 0508  WBC 3.6* 4.9 5.0 4.5  HGB 12.9 12.5 13.0 12.4  HCT 38.3 37.1 38.2 37.0  MCV 79.1* 77.9* 80.1 80.8  PLT 152 158 163 [id number]   Basic Metabolic Panel: Recent Labs  Lab 10/20/22 1807 10/22/22 0439  NA 140 138  K 3.9 4.2  CL 108 105  CO2 25 26  GLUCOSE 163* 99  BUN 18 18  CREATININE 1.06* 0.67  CALCIUM 9.2 9.3   Liver Function Tests: Recent Labs  Lab 10/22/22 0439  AST 18  ALT 18  ALKPHOS 88  BILITOT 0.7  PROT 6.6  ALBUMIN 3.9   CBG: No results for input(s): "GLUCAP" in the last 168 hours.  Discharge time spent: less than 30 minutes.  Signed: Annita Brod, MD [facility] 10/23/2022

## 2022-10-23 NOTE — Progress Notes (Signed)
Patient off floor to cath lab.  

## 2022-10-23 NOTE — Interval H&P Note (Signed)
Cath Lab Visit (complete for each Cath Lab visit)  Clinical Evaluation Leading to the Procedure:   ACS: Yes.    Non-ACS:  n/a     History and Physical Interval Note:  10/23/2022 9:29 AM  Donna Prince  has presented today for surgery, with the diagnosis of NSTEMI.  The various methods of treatment have been discussed with the patient and family. After consideration of risks, benefits and other options for treatment, the patient has consented to  Procedure(s): LEFT HEART CATH AND CORONARY ANGIOGRAPHY (N/A) as a surgical intervention.  The patient's history has been reviewed, patient examined, no change in status, stable for surgery.  I have reviewed the patient's chart and labs.  Questions were answered to the patient's satisfaction.     Kathlyn Sacramento

## 2022-10-23 NOTE — Telephone Encounter (Signed)
-----   Message from Wellington Hampshire, MD sent at 10/23/2022 10:17 AM EDT ----- TCM follow-up needed in 1 to 2 weeks with APP

## 2022-10-23 NOTE — Telephone Encounter (Signed)
Called to schedule follow up No VM  

## 2022-10-23 NOTE — Progress Notes (Signed)
Cardiology Progress Note  Patient ID: LASHAWNDRA LAMPKINS MRN: [id number] DOB: January 30, 1961 Date of Encounter: 10/23/2022  Primary Cardiologist: None  Subjective   Chief Complaint: None.  HPI: No chest pain or shortness of breath.  Left heart catheterization done this morning via the right radial artery showed patent OM stent with no evidence of obstructive coronary artery disease.  ROS:  All other ROS reviewed and negative. Pertinent positives noted in the HPI.     Inpatient Medications  Scheduled Meds:  [MAR Hold] amLODipine  5 mg Oral Daily   aspirin       [START ON 10/24/2022] aspirin  81 mg Oral Pre-Cath   [MAR Hold] aspirin EC  81 mg Oral Daily   [MAR Hold] atorvastatin  80 mg Oral Daily   [MAR Hold] carvedilol  25 mg Oral BID WC   [MAR Hold] fluticasone  1 spray Each Nare Daily   [MAR Hold] hydrALAZINE  50 mg Oral Q8H   [MAR Hold] sodium chloride flush  3 mL Intravenous Q12H   Continuous Infusions:  sodium chloride     [START ON 10/24/2022] sodium chloride 3 mL/kg/hr (10/23/22 0906)   Followed by   Melene Muller ON 10/24/2022] sodium chloride     heparin Stopped (10/23/22 0845)   PRN Meds: sodium chloride, [MAR Hold] acetaminophen, [MAR Hold] ALPRAZolam, aspirin, fentaNYL, Heparin (Porcine) in NaCl, heparin sodium (porcine), [MAR Hold] hydrALAZINE, [MAR Hold] HYDROcodone-acetaminophen, iohexol, lidocaine (PF), midazolam, [MAR Hold]  morphine injection, [MAR Hold] nitroGLYCERIN, [MAR Hold] ondansetron (ZOFRAN) IV, [MAR Hold] ondansetron **OR** [DISCONTINUED] ondansetron (ZOFRAN) IV, [MAR Hold] mouth rinse, sodium chloride flush, verapamil   Vital Signs   Vitals:   10/23/22 0934 10/23/22 0939 10/23/22 0944 10/23/22 0949  BP: 113/70 115/72 115/69 117/72  Pulse: (!) 59 64 65 66  Resp: 12 18 17 16   Temp:      TempSrc:      SpO2: 98% 97% 98% 98%  Weight:      Height:        Intake/Output Summary (Last 24 hours) at 10/23/2022 1012 Last data filed at 10/22/2022 1500 Gross per  24 hour  Intake 153.77 ml  Output --  Net 153.77 ml       10/23/2022    9:00 AM 10/20/2022    6:04 PM 07/21/2019    8:00 PM  Last 3 Weights  Weight (lbs) 190 lb 180 lb 216 lb  Weight (kg) 86.183 kg 81.647 kg 97.977 kg      Telemetry  Overnight telemetry shows normal sinus rhythm, which I personally reviewed.   ECG  No EKG from today  Physical Exam   Vitals:   10/23/22 0934 10/23/22 0939 10/23/22 0944 10/23/22 0949  BP: 113/70 115/72 115/69 117/72  Pulse: (!) 59 64 65 66  Resp: 12 18 17 16   Temp:      TempSrc:      SpO2: 98% 97% 98% 98%  Weight:      Height:        Intake/Output Summary (Last 24 hours) at 10/23/2022 1012 Last data filed at 10/22/2022 1500 Gross per 24 hour  Intake 153.77 ml  Output --  Net 153.77 ml        10/23/2022    9:00 AM 10/20/2022    6:04 PM 07/21/2019    8:00 PM  Last 3 Weights  Weight (lbs) 190 lb 180 lb 216 lb  Weight (kg) 86.183 kg 81.647 kg 97.977 kg    Body mass index is 30.67 kg/m.  General: Well nourished, well developed, in no acute distress Head: Atraumatic, normal size  Eyes: PEERLA, EOMI  Neck: Supple, no JVD Endocrine: No thryomegaly Cardiac: Normal S1, S2; RRR; no murmurs, rubs, or gallops Lungs: Clear to auscultation bilaterally, no wheezing, rhonchi or rales  Abd: Soft, nontender, no hepatomegaly  Ext: No edema, pulses 2+ Musculoskeletal: No deformities, BUE and BLE strength normal and equal Skin: Warm and dry, no rashes   Neuro: Alert and oriented to person, place, time, and situation, CNII-XII grossly intact, no focal deficits  Psych: Normal mood and affect   Labs  High Sensitivity Troponin:   Recent Labs  Lab 10/20/22 1807 10/20/22 2020 10/21/22 0257 10/21/22 0507 10/22/22 0439  TROPONINIHS 31* 574* 2,154* 1,665* 566*      Cardiac EnzymesNo results for input(s): "TROPONINI" in the last 168 hours. No results for input(s): "TROPIPOC" in the last 168 hours.  Chemistry Recent Labs  Lab  10/20/22 1807 10/22/22 0439  NA 140 138  K 3.9 4.2  CL 108 105  CO2 25 26  GLUCOSE 163* 99  BUN 18 18  CREATININE 1.06* 0.67  CALCIUM 9.2 9.3  PROT  --  6.6  ALBUMIN  --  3.9  AST  --  18  ALT  --  18  ALKPHOS  --  88  BILITOT  --  0.7  GFRNONAA >60 >60  ANIONGAP 7 7     Hematology Recent Labs  Lab 10/21/22 1107 10/22/22 0439 10/23/22 0508  WBC 4.9 5.0 4.5  RBC 4.76 4.77 4.58  HGB 12.5 13.0 12.4  HCT 37.1 38.2 37.0  MCV 77.9* 80.1 80.8  MCH 26.3 27.3 27.1  MCHC 33.7 34.0 33.5  RDW 13.2 13.2 13.5  PLT 158 163 172    BNP Recent Labs  Lab 10/21/22 1649  BNP 200.0*     DDimer No results for input(s): "DDIMER" in the last 168 hours.   Radiology  CARDIAC CATHETERIZATION  Result Date: 10/23/2022   Mid Cx lesion is 20% stenosed.   Previously placed 1st Mrg stent of unknown type is  widely patent.   The left ventricular systolic function is normal.   LV end diastolic pressure is mildly elevated.   The left ventricular ejection fraction is 55-65% by visual estimate. 1.  Widely patent OM stent with no significant restenosis.  Tortuous coronary arteries with mild nonobstructive coronary artery disease. 2.  Normal LV systolic function.  Mildly elevated left ventricular end-diastolic pressure. Recommendations: Elevated troponin is likely due to supply demand mismatch in the setting of uncontrolled hypertension. Recommend continuing medical therapy.   ECHOCARDIOGRAM COMPLETE  Result Date: 10/21/2022    ECHOCARDIOGRAM REPORT   Patient Name:   Candie ChromanMARY E Ashline Date of Exam: 10/21/2022 Medical Rec #:  [id number]       Height:       66.0 in Accession #:    [id number]      Weight:       180.0 lb Date of Birth:  09/03/1961        BSA:          1.912 m Patient Age:    61 years        BP:           136/80 mmHg Patient Gender: F               HR:           74 bpm. Exam Location:  [facility] Procedure: 2D Echo, Cardiac Doppler and  Color Doppler Indications:     NSTEMI  History:         Patient  has no prior history of Echocardiogram examinations.                  Acute MI and CAD; Risk Factors:Hypertension.  Sonographer:     Wenda Low Referring Phys:  [id number] Athena Masse Diagnosing Phys: [place]  1. Left ventricular ejection fraction, by estimation, is 60 to 65%. The left ventricle has normal function. The left ventricle has no regional wall motion abnormalities. The left ventricular internal cavity size was mildly dilated. There is severe concentric left ventricular hypertrophy. Left ventricular diastolic parameters are consistent with Grade I diastolic dysfunction (impaired relaxation).  2. Right ventricular systolic function is mildly reduced. The right ventricular size is mildly enlarged. Mildly increased right ventricular wall thickness. There is normal pulmonary artery systolic pressure.  3. Left atrial size was moderately dilated.  4. Right atrial size was moderately dilated.  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 Mild mitral valve regurgitation.  6. Tricuspid valve regurgitation is mild to moderate.  7. The aortic valve is calcified. Aortic valve regurgitation is trivial. Aortic valve sclerosis/calcification is present, without any evidence of aortic stenosis. FINDINGS  Left Ventricle: Left ventricular ejection fraction, by estimation, is 60 to 65%. The left ventricle has normal function. The left ventricle has no regional wall motion abnormalities. The left ventricular internal cavity size was mildly dilated. There is  severe concentric left ventricular hypertrophy. Left ventricular diastolic parameters are consistent with Grade I diastolic dysfunction (impaired relaxation). Right Ventricle: The right ventricular size is mildly enlarged. Mildly increased right ventricular wall thickness. Right ventricular systolic function is mildly reduced. There is normal pulmonary artery systolic pressure. The tricuspid regurgitant velocity is 2.57 m/s, and with an assumed right atrial  pressure of 3 mmHg, the estimated right ventricular systolic pressure is 14.9 mmHg. Left Atrium: Left atrial size was moderately dilated. Right Atrium: Right atrial size was moderately dilated. Pericardium: There is no evidence of pericardial effusion. Mitral Valve: The mitral valve is myxomatous. Mild mitral valve regurgitation. MV peak gradient, 5.0 mmHg. The mean mitral valve gradient is 2.0 mmHg. Tricuspid Valve: The tricuspid valve is grossly normal. Tricuspid valve regurgitation is mild to moderate. Aortic Valve: The aortic valve is calcified. Aortic valve regurgitation is trivial. Aortic valve sclerosis/calcification is present, without any evidence of aortic stenosis. Aortic valve mean gradient measures 5.0 mmHg. Aortic valve peak gradient measures 9.0 mmHg. Aortic valve area, by VTI measures 2.65 cm. Pulmonic Valve: The pulmonic valve was grossly normal. Pulmonic valve regurgitation is trivial. Aorta: The aortic root, ascending aorta and aortic arch are all structurally normal, with no evidence of dilitation or obstruction. IAS/Shunts: No atrial level shunt detected by color flow Doppler.  LEFT VENTRICLE PLAX 2D LVIDd:         4.40 cm   Diastology LVIDs:         3.10 cm   LV e' medial:    5.98 cm/s LV PW:         1.10 cm   LV E/e' medial:  12.2 LV IVS:        1.20 cm   LV e' lateral:   8.16 cm/s LVOT diam:     2.10 cm   LV E/e' lateral: 9.0 LV SV:         94 LV SV Index:   49 LVOT Area:     3.46 cm  RIGHT  VENTRICLE RV Basal diam:  3.30 cm RV Mid diam:    2.90 cm RV S prime:     16.20 cm/s TAPSE (M-mode): 2.5 cm LEFT ATRIUM             Index        RIGHT ATRIUM           Index LA diam:        4.20 cm 2.20 cm/m   RA Area:     13.70 cm LA Vol (A2C):   76.8 ml 40.16 ml/m  RA Volume:   32.60 ml  17.05 ml/m LA Vol (A4C):   55.1 ml 28.81 ml/m LA Biplane Vol: 69.7 ml 36.45 ml/m  AORTIC VALVE                     PULMONIC VALVE AV Area (Vmax):    3.07 cm      PV Vmax:       1.15 m/s AV Area (Vmean):   2.70  cm      PV Peak grad:  5.3 mmHg AV Area (VTI):     2.65 cm AV Vmax:           150.00 cm/s AV Vmean:          113.000 cm/s AV VTI:            0.356 m AV Peak Grad:      9.0 mmHg AV Mean Grad:      5.0 mmHg LVOT Vmax:         133.00 cm/s LVOT Vmean:        88.200 cm/s LVOT VTI:          0.272 m LVOT/AV VTI ratio: 0.76  AORTA Ao Root diam: 3.10 cm MITRAL VALVE               TRICUSPID VALVE MV Area (PHT): 2.81 cm    TR Peak grad:   26.4 mmHg MV Area VTI:   2.83 cm    TR Vmax:        257.00 cm/s MV Peak grad:  5.0 mmHg MV Mean grad:  2.0 mmHg    SHUNTS MV Vmax:       1.12 m/s    Systemic VTI:  0.27 m MV Vmean:      65.0 cm/s   Systemic Diam: 2.10 cm MV Decel Time: 270 msec MV E velocity: 73.20 cm/s MV A velocity: 98.20 cm/s MV E/A ratio:  0.75 Shaukat Khan Electronically signed by Adrian Blackwater Signature Date/Time: 10/21/2022/2:22:22 PM    Final     Cardiac Studies   Left heart cath at [facility] 2020/06/16 D1 70% OM2 20% (in-stent restenosis)   Left heart cath at [facility] 01/2019 OM2 99% -> PCI  TTE 10/21/2022  1. Left ventricular ejection fraction, by estimation, is 60 to 65%. The  left ventricle has normal function. The left ventricle has no regional  wall motion abnormalities. The left ventricular internal cavity size was  mildly dilated. There is severe  concentric left ventricular hypertrophy. Left ventricular diastolic  parameters are consistent with Grade I diastolic dysfunction (impaired  relaxation).   2. Right ventricular systolic function is mildly reduced. The right  ventricular size is mildly enlarged. Mildly increased right ventricular  wall thickness. There is normal pulmonary artery systolic pressure.   3. Left atrial size was moderately dilated.   4. Right atrial size was moderately dilated.   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 Mild  mitral valve regurgitation.   6. Tricuspid valve regurgitation is mild to moderate.   7. The aortic valve is calcified. Aortic valve regurgitation is trivial.   Aortic valve sclerosis/calcification is present, without any evidence of  aortic stenosis.   Patient Profile  61 year old female with history of CAD status post PCI, hypertension, hyperlipidemia admitted on 10/21/2022 with non-STEMI.  Assessment & Plan   1. Elevated troponin: Cardiac catheterization done this morning showed patent OM stent with mild nonobstructive coronary artery disease.  Based on this, elevated troponin is due to supply demand mismatch in the setting of uncontrolled hypertension and not myocardial infarction.  Recommend continuing medical therapy.  2.  Essential hypertension: Blood pressure is now well controlled on oral medications including carvedilol, amlodipine and hydralazine.  The patient stopped all her meds months before her current hospitalization which was the likely culprit for elevated blood pressure.  3.  Hyperlipidemia: Continue high-dose atorvastatin.   The patient can be discharged home later today after recovery if no other issues. For questions or updates, please contact [facility] Please consult [email or web address] for contact info under      Signed, Lorine Bears, MD, [facility]  [facility]  10/23/2022 10:12 AM

## 2022-10-23 NOTE — Progress Notes (Signed)
Patient waiting on ride from daughter to take her home.

## 2022-10-24 ENCOUNTER — Encounter: Payer: Self-pay | Admitting: Cardiovascular Disease

## 2022-10-24 LAB — LIPOPROTEIN A (LPA): Lipoprotein (a): 13.5 nmol/L (ref <75.0)

## 2022-10-24 LAB — T3, FREE: T3, Free: 5.8 pg/mL — ABNORMAL HIGH (ref 2.0–4.4)

## 2022-10-31 ENCOUNTER — Ambulatory Visit: Payer: 59 | Attending: Cardiovascular Disease | Admitting: Cardiovascular Disease

## 2022-10-31 NOTE — Progress Notes (Deleted)
Cardiology Office Note   Date:  10/31/2022   ID:  Donna Prince, DOB 1961/04/25, MRN [id number]  PCP:  Alfonse Flavors, MD  Cardiologist:  Kathlyn Sacramento, MD   No chief complaint on file.     History of Present Illness: Donna Prince is a 61 y.o. female who presents for ***    Past Medical History:  Diagnosis Date   Hypertension    MI, acute, non ST segment elevation ([facility]) 2020    Past Surgical History:  Procedure Laterality Date   LEFT HEART CATH AND CORONARY ANGIOGRAPHY N/A 10/23/2022   Procedure: LEFT HEART CATH AND CORONARY ANGIOGRAPHY;  Surgeon: Wellington Hampshire, MD;  Location: [place] [facility];  Service: Cardiovascular;  Laterality: N/A;     Current Outpatient Medications  Medication Sig Dispense Refill   acetaminophen (TYLENOL) 325 MG tablet Take 650 mg by mouth every 6 (six) hours as needed for headache or mild pain.     albuterol (VENTOLIN HFA) 108 (90 Base) MCG/ACT inhaler Inhale 2 puffs into the lungs every 6 (six) hours as needed for wheezing or shortness of breath. 8 g 2   amLODipine (NORVASC) 5 MG tablet Take 1 tablet (5 mg total) by mouth daily. 30 tablet 11   aspirin EC 81 MG tablet Take 1 tablet (81 mg total) by mouth daily. Swallow whole. 30 tablet 12   atorvastatin (LIPITOR) 80 MG tablet Take 1 tablet (80 mg total) by mouth daily. 30 tablet 1   carvedilol (COREG) 25 MG tablet Take 1 tablet (25 mg total) by mouth 2 (two) times daily with a meal. 60 tablet 11   hydrALAZINE (APRESOLINE) 50 MG tablet Take 1 tablet (50 mg total) by mouth every 8 (eight) hours. 90 tablet 11   ibuprofen (ADVIL) 200 MG tablet Take 400 mg by mouth every 6 (six) hours as needed for moderate pain.     No current facility-administered medications for this visit.    Allergies:   Lisinopril, Losartan, Mangifera indica, Amoxicillin, Poison oak extract, and Penicillins    Social History:  The patient  reports that she has never smoked. She has never used  smokeless tobacco. She reports that she does not drink alcohol and does not use drugs.   Family History:  The patient's ***family history is not on file.    ROS:  Please see the history of present illness.   Otherwise, review of systems are positive for {NONE DEFAULTED:18576}.   All other systems are reviewed and negative.    PHYSICAL EXAM: VS:  There were no vitals taken for this visit. , BMI There is no height or weight on file to calculate BMI. GEN: Well nourished, well developed, in no acute distress  HEENT: normal  Neck: no JVD, carotid bruits, or masses Cardiac: ***RRR; no murmurs, rubs, or gallops,no edema  Respiratory:  clear to auscultation bilaterally, normal work of breathing GI: soft, nontender, nondistended, + BS MS: no deformity or atrophy  Skin: warm and dry, no rash Neuro:  Strength and sensation are intact Psych: euthymic mood, full affect   EKG:  EKG {ACTION; IS/IS BTD:[id number]} ordered today. The ekg ordered today demonstrates ***   Recent Labs: 10/21/2022: B Natriuretic Peptide 200.0; TSH 0.034 10/22/2022: ALT 18; BUN 18; Creatinine, Ser 0.67; Potassium 4.2; Sodium 138 10/23/2022: Hemoglobin 12.4; Platelets 172    Lipid Panel    Component Value Date/Time   CHOL 183 10/22/2022 0439   TRIG 87 10/22/2022 0439  HDL 55 10/22/2022 0439   CHOLHDL 3.3 10/22/2022 0439   VLDL 17 10/22/2022 0439   LDLCALC 111 (H) 10/22/2022 0439      Wt Readings from Last 3 Encounters:  10/23/22 190 lb (86.2 kg)  07/21/19 216 lb (98 kg)      Other studies Reviewed: Additional studies/ records that were reviewed today include: ***. Review of the above records demonstrates: ***      No data to display            ASSESSMENT AND PLAN:  1.  ***    Disposition:   FU with *** in {gen number 1-49:[id number]} {Days to years:10300}  Signed,  Lorine Bears, MD  10/31/2022 1:50 PM    [facility]

## 2022-11-01 ENCOUNTER — Encounter: Payer: Self-pay | Admitting: Cardiovascular Disease

## 2022-11-28 ENCOUNTER — Ambulatory Visit: Payer: 59 | Admitting: Medical

## 2022-11-29 ENCOUNTER — Encounter: Payer: Self-pay | Admitting: Medical

## 2022-11-29 ENCOUNTER — Other Ambulatory Visit: Payer: Self-pay | Admitting: *Deleted

## 2022-11-29 ENCOUNTER — Ambulatory Visit: Payer: 59 | Attending: Cardiovascular Disease | Admitting: Medical

## 2022-11-29 VITALS — BP 106/65 | HR 65 | Ht 65.0 in | Wt 186.4 lb

## 2022-11-29 DIAGNOSIS — Z9861 Coronary angioplasty status: Secondary | ICD-10-CM | POA: Diagnosis not present

## 2022-11-29 DIAGNOSIS — I5032 Chronic diastolic (congestive) heart failure: Secondary | ICD-10-CM

## 2022-11-29 DIAGNOSIS — I214 Non-ST elevation (NSTEMI) myocardial infarction: Secondary | ICD-10-CM

## 2022-11-29 DIAGNOSIS — I10 Essential (primary) hypertension: Secondary | ICD-10-CM | POA: Diagnosis not present

## 2022-11-29 DIAGNOSIS — I2510 Atherosclerotic heart disease of native coronary artery without angina pectoris: Secondary | ICD-10-CM | POA: Diagnosis not present

## 2022-11-29 DIAGNOSIS — G4733 Obstructive sleep apnea (adult) (pediatric): Secondary | ICD-10-CM

## 2022-11-29 DIAGNOSIS — E782 Mixed hyperlipidemia: Secondary | ICD-10-CM

## 2022-11-29 MED ORDER — NITROGLYCERIN 0.4 MG SL SUBL: 0.4000 mg | SUBLINGUAL_TABLET | SUBLINGUAL | 3 refills | Status: DC | PRN

## 2022-11-29 NOTE — Progress Notes (Signed)
Cardiology Office Note:    Date:  11/29/2022   ID:  Donna Prince, DOB Jan 22, 1961, MRN [id number]  PCP:  Tanna Furry, MD  [facility] Cardiologist:  None  [facility] Electrophysiologist:  None   Referring MD: Tanna Furry   Chief Complaint: Hospital follow-up  History of Present Illness:    Donna Prince is a 61 y.o. female with a hx of CAD status post DES to OM2 in 2020, OSA, hypertension, and hyperlipidemia who is being seen for hospital follow-up.  Patient was previous seen by [facility] cardiology.   January 2020 patient presented with chest pain found to have STEMI.  Cath showed 99% mid OM 2 lesion treated with DES.  Echo showed normal LV function.  She has allergy to lisinopril with angioedema.  She tolerates losartan.  She was started on DAPT with aspirin and prasugrel.  In 06/13/2020 the patient presented with acute chest pressure and dyspnea in the setting of being off her DAPT for 2 weeks.  She was admitted for non-STEMI/scad.  PCP has switched prasugrel to Plavix.  Unfortunately, she had stopped her prasugrel and not taking her Plavix.  Troponin peaked at 9.8.  She underwent left heart cath on 628 that showed very tortuous coronary arteries, ostial 70% stenosis of small first diagonal, TIMI-3 flow in the previously stented marginal,, concerns for possible scad of a small ramus branch.  Medical management was recommended. Echo showed LVEF>55%. She was discharged on aspirin, Plavix and home metoprolol.   Patient presented to [facility] ED 10/20/2022 with chest pain.  Her blood pressure was 180/107, pulse rate 107 bpm.  High-sensitivity troponin was 31 and 574.  CT negative for PE.  EKG showed sinus tach.  She was given aspirin, nitroglycerin, started on IV heparin.  She reported she had not taken her cardiac medications in months.  Echo showed LVEF 60 to 65%, no wall motion abnormalities, grade 1 diastolic dysfunction, mildly reduced RV function, moderately dilated  by atrium, mild MR, mild to moderate TR.  Cardiac cath showed patent LM stent with mild nonobstructive CAD.  Suspect elevated troponin due to supply demand mismatch in the setting of uncontrolled hypertension.  Patient was re-started on Coreg, amlodipine, and hydralazine.  Today, the patient reports she had possible nicotine poisoning followed by food poisoning on Thanksgiving. She denies chest pain or shortness of breath. She does report some arthritis and joint pain. No lower leg edema, orthopnea, pnd. No lightheadedness or dizziness. She takes tylenol as needed for pain. She is not sure if she has sleep apnea.   Past Medical History:  Diagnosis Date   Asthma    Coronary artery disease    Hyperlipidemia    Hypertension    MI, acute, non ST segment elevation (HCC) 2020    Past Surgical History:  Procedure Laterality Date   CARDIAC CATHETERIZATION  2020   x2 stents   LEFT HEART CATH AND CORONARY ANGIOGRAPHY N/A 10/23/2022   Procedure: LEFT HEART CATH AND CORONARY ANGIOGRAPHY;  Surgeon: Iran Ouch, MD;  Location: [facility] INVASIVE CV LAB;  Service: Cardiovascular;  Laterality: N/A;    Current Medications: Current Meds  Medication Sig   acetaminophen (TYLENOL) 325 MG tablet Take 650 mg by mouth every 6 (six) hours as needed for headache or mild pain.   albuterol (VENTOLIN HFA) 108 (90 Base) MCG/ACT inhaler Inhale 2 puffs into the lungs every 6 (six) hours as needed for wheezing or shortness of breath.   amLODipine (NORVASC) 5  MG tablet Take 1 tablet (5 mg total) by mouth daily.   aspirin EC 81 MG tablet Take 1 tablet (81 mg total) by mouth daily. Swallow whole.   atorvastatin (LIPITOR) 80 MG tablet Take 1 tablet (80 mg total) by mouth daily.   carvedilol (COREG) 25 MG tablet Take 1 tablet (25 mg total) by mouth 2 (two) times daily with a meal.   hydrALAZINE (APRESOLINE) 50 MG tablet Take 1 tablet (50 mg total) by mouth every 8 (eight) hours.   ibuprofen (ADVIL) 200 MG tablet Take 400  mg by mouth every 6 (six) hours as needed for moderate pain.   nitroGLYCERIN (NITROSTAT) 0.4 MG SL tablet Place 1 tablet (0.4 mg total) under the tongue every 5 (five) minutes as needed for chest pain.     Allergies:   Lisinopril, Losartan, Mangifera indica, Amoxicillin, Poison oak extract, and Penicillins   Social History   Socioeconomic History   Marital status: Married    Spouse name: Not on file   Number of children: Not on file   Years of education: Not on file   Highest education level: Not on file  Occupational History   Not on file  Tobacco Use   Smoking status: Never   Smokeless tobacco: Never  Vaping Use   Vaping Use: Never used  Substance and Sexual Activity   Alcohol use: Never   Drug use: Never   Sexual activity: Not Currently  Other Topics Concern   Not on file  Social History Narrative   Not on file   Social Determinants of Health   Financial Resource Strain: Not on file  Food Insecurity: No Food Insecurity (10/21/2022)   Hunger Vital Sign    Worried About Running Out of Food in the Last Year: Never true    Ran Out of Food in the Last Year: Never true  Transportation Needs: No Transportation Needs (10/21/2022)   PRAPARE - Administrator, Civil ServiceTransportation    Lack of Transportation (Medical): No    Lack of Transportation (Non-Medical): No  Physical Activity: Not on file  Stress: Not on file  Social Connections: Not on file     Family History: The patient's family history is not on file.  ROS:   Please see the history of present illness.     All other systems reviewed and are negative.  EKGs/Labs/Other Studies Reviewed:    The following studies were reviewed today:  Cardiac cath 09/2022      Mid Cx lesion is 20% stenosed.   Previously placed 1st Mrg stent of unknown type is  widely patent.   The left ventricular systolic function is normal.   LV end diastolic pressure is mildly elevated.   The left ventricular ejection fraction is 55-65% by visual estimate.    1.  Widely patent OM stent with no significant restenosis.  Tortuous coronary arteries with mild nonobstructive coronary artery disease. 2.  Normal LV systolic function.  Mildly elevated left ventricular end-diastolic pressure.   Recommendations: Elevated troponin is likely due to supply demand mismatch in the setting of uncontrolled hypertension. Recommend continuing medical therapy.   Coronary Diagrams  Diagnostic Dominance: Right  Intervention   Echo 10/21/22 1. Left ventricular ejection fraction, by estimation, is 60 to 65%. The  left ventricle has normal function. The left ventricle has no regional  wall motion abnormalities. The left ventricular internal cavity size was  mildly dilated. There is severe  concentric left ventricular hypertrophy. Left ventricular diastolic  parameters are consistent with Grade I  diastolic dysfunction (impaired  relaxation).   2. Right ventricular systolic function is mildly reduced. The right  ventricular size is mildly enlarged. Mildly increased right ventricular  wall thickness. There is normal pulmonary artery systolic pressure.   3. Left atrial size was moderately dilated.   4. Right atrial size was moderately dilated.   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 Mild mitral valve regurgitation.   6. Tricuspid valve regurgitation is mild to moderate.   7. The aortic valve is calcified. Aortic valve regurgitation is trivial.  Aortic valve sclerosis/calcification is present, without any evidence of  aortic stenosis.   EKG:  EKG is  ordered today.  The ekg ordered today demonstrates NSR 65bpm, nonspecific T wave changes  Recent Labs: 10/21/2022: B Natriuretic Peptide 200.0; TSH 0.034 10/22/2022: ALT 18; BUN 18; Creatinine, Ser 0.67; Potassium 4.2; Sodium 138 10/23/2022: Hemoglobin 12.4; Platelets 172  Recent Lipid Panel    Component Value Date/Time   CHOL 183 10/22/2022 0439   TRIG 87 10/22/2022 0439   HDL 55 10/22/2022 0439   CHOLHDL 3.3  10/22/2022 0439   VLDL 17 10/22/2022 0439   LDLCALC 111 (H) 10/22/2022 0439    Physical Exam:    VS:  BP 106/65 (BP Location: Left Arm, Patient Position: Sitting, Cuff Size: Normal)   Pulse 65   Ht 5\' 5"  (1.651 m)   Wt 186 lb 6 oz (84.5 kg)   SpO2 98%   BMI 31.01 kg/m     Wt Readings from Last 3 Encounters:  11/29/22 186 lb 6 oz (84.5 kg)  10/23/22 190 lb (86.2 kg)  07/21/19 216 lb (98 kg)     GEN:  Well nourished, well developed in no acute distress HEENT: Normal NECK: No JVD; No carotid bruits LYMPHATICS: No lymphadenopathy CARDIAC: RRR, no murmurs, rubs, gallops RESPIRATORY:  Clear to auscultation without rales, wheezing or rhonchi  ABDOMEN: Soft, non-tender, non-distended MUSCULOSKELETAL:  No edema; No deformity  SKIN: Warm and dry NEUROLOGIC:  Alert and oriented x 3 PSYCHIATRIC:  Normal affect   ASSESSMENT:    1. Coronary artery disease involving native coronary artery of native heart without angina pectoris   2. CAD S/P percutaneous coronary angioplasty   3. Chronic diastolic CHF (congestive heart failure) (HCC)   4. Essential hypertension   5. Hyperlipidemia, mixed   6. Obstructive sleep apnea    PLAN:    In order of problems listed above  Elevated troponin CAD s/p DES OM in 2020 Recent hospitalization for chest pain found to have elevated troponin  Cardiac cath showed patent OM stent with otherwise nonobstructive coronary artery disease.  It was felt elevated troponin was supply demand mismatch in the setting of high blood pressure. Patient has been doing well since her discharge. Right radial cath site is stable. She denies anginal symptoms. I will refer her to cardiac rehab. Continue Aspirin, Coreg and Lipitor.  I will send in SL NTG. Patient plans to continue to follow with [facility].  Diastolic dysfunction Echo showed normal LVEF, severe LVH with G1DD. Patient is euvolemic on exam. Continue Coreg. We discussed low salt diet, leg elevation and compression  socks. BP is soft, she is on hydralazine and amlodipine. May consider GDMT at follow-up.   Hypertension BP was severely elevated in the hospital. BP today is 106/60. Continue Hydralazine, amlodipine, and Coreg 25mg  BID.   Hyperlipidemia LDL 111.  Lipitor was increased to 80 mg daily re-check cholesterol at follow-up.   Possible OSA I will refer to pulmonology for  further work-up.   Disposition: Follow up in 1 month(s) with MD/APP    Signed, Keyanna Sandefer David Stall, PA-C  11/29/2022 11:49 AM    [facility]

## 2022-11-29 NOTE — Patient Instructions (Signed)
Medication Instructions:  - Your physician has recommended you make the following change in your medication:   1) Nitroglycerin 0.4 mg sublingual tablets: - If you experience chest pain: Place 1 nitroglycerin under your tongue, while sitting. If no relief of pain, may repeat 1 tablet every 5 minutes up to 3 tablets over 15 minutes. If no relief call 911. If you have dizziness/lightheadedness while taking nitroglycerin, stop taking and call 911.    *If you need a refill on your cardiac medications before your next appointment, please call your pharmacy*   Lab Work: - none ordered  If you have labs (blood work) drawn today and your tests are completely normal, you will receive your results only by: MyChart Message (if you have MyChart) OR A paper copy in the mail If you have any lab test that is abnormal or we need to change your treatment, we will call you to review the results.   Testing/Procedures:  1) You have been referred to cardiac rehab. This is a combination program including monitored exercise, dietary education, and support group. We strongly recommend participating in the program. Expect a phone call from them in approximately 2 weeks. If it has been more than 2 weeks and you have not heard from the Cardiac Rehab Department, please call them directly at [phone]    2) You have been referred to : [facility] for evaluation of sleep apnea. Their office will call you directly to schedule, but it has been more than 1 week and you have not heard from them, call them directly at [phone].     Follow-Up: At [facility], you and your health needs are our priority.  As part of our continuing mission to provide you with exceptional heart care, we have created designated Provider Care Teams.  These Care Teams include your primary Cardiologist (physician) and Advanced Practice Providers (APPs -  Physician Assistants and Nurse Practitioners) who all work  together to provide you with the care you need, when you need it.  We recommend signing up for the patient portal called "MyChart".  Sign up information is provided on this After Visit Summary.  MyChart is used to connect with patients for Virtual Visits (Telemedicine).  Patients are able to view lab/test results, encounter notes, upcoming appointments, etc.  Non-urgent messages can be sent to your provider as well.   To learn more about what you can do with MyChart, go to [email or web address].    Your next appointment:   1 month(s)  The format for your next appointment:   In Person  Provider:   You may see None or one of the following Advanced Practice Providers on your designated Care Team:    Cadence Fransico Michael, [place]   Other Instructions N/a  Important Information About Sugar

## 2022-12-03 ENCOUNTER — Emergency Department
Admission: EM | Admit: 2022-12-03 | Discharge: 2022-12-03 | Discharge status: Against Medical Advice | Payer: 59 | Attending: Emergency Medicine | Admitting: Emergency Medicine

## 2022-12-03 DIAGNOSIS — R0902 Hypoxemia: Secondary | ICD-10-CM | POA: Insufficient documentation

## 2022-12-03 DIAGNOSIS — Z5321 Procedure and treatment not carried out due to patient leaving prior to being seen by health care provider: Secondary | ICD-10-CM | POA: Insufficient documentation

## 2022-12-03 NOTE — ED Notes (Signed)
Pt states that she does not want to wait, explained that we would be taking her back momentarily but pt insisted on leaving.

## 2022-12-03 NOTE — ED Triage Notes (Signed)
Pt sts that her watch told her that her HR and oxygen were low so she wanted to come and get checked out.

## 2022-12-12 ENCOUNTER — Encounter: Payer: Self-pay | Admitting: Adult Health

## 2022-12-12 ENCOUNTER — Ambulatory Visit (INDEPENDENT_AMBULATORY_CARE_PROVIDER_SITE_OTHER): Payer: Self-pay | Admitting: Adult Health

## 2022-12-12 VITALS — BP 130/90 | HR 106 | Temp 97.7°F | Ht 65.0 in | Wt 193.4 lb

## 2022-12-12 DIAGNOSIS — J45909 Unspecified asthma, uncomplicated: Secondary | ICD-10-CM

## 2022-12-12 DIAGNOSIS — R400 Somnolence: Secondary | ICD-10-CM | POA: Insufficient documentation

## 2022-12-12 MED ORDER — BUDESONIDE-FORMOTEROL FUMARATE 80-4.5 MCG/ACT IN AERO: 2.0000 | INHALATION_SPRAY | Freq: Two times a day (BID) | RESPIRATORY_TRACT | 5 refills | Status: DC

## 2022-12-12 MED ORDER — ALBUTEROL SULFATE HFA 108 (90 BASE) MCG/ACT IN AERS: 1.0000 | INHALATION_SPRAY | Freq: Four times a day (QID) | RESPIRATORY_TRACT | 2 refills | Status: DC | PRN

## 2022-12-12 NOTE — Assessment & Plan Note (Signed)
Daytime sleepiness, restless sleep, snoring, possible previous diagnosis of sleep apnea all concerning for underlying ongoing sleep apnea. Will set patient up for home sleep study.  Patient education given on sleep apnea  - discussed how weight can impact sleep and risk for sleep disordered breathing - discussed options to assist with weight loss: combination of diet modification, cardiovascular and strength training exercises   - had an extensive discussion regarding the adverse health consequences related to untreated sleep disordered breathing - specifically discussed the risks for hypertension, coronary artery disease, cardiac dysrhythmias, cerebrovascular disease, and diabetes - lifestyle modification discussed   - discussed how sleep disruption can increase risk of accidents, particularly when driving - safe driving practices were discussed   Plan  Patient Instructions  Set up for home sleep study Healthy sleep regimen Do not drive if sleepy Work on healthy weight loss  Begin Symbicort 80 2 puffs Twice daily   Albuterol inhaler 1-2 puffs every 6hrs as needed for wheezing-this is your rescue inhaler  Zyrtec 10mg  At bedtime  As needed   Follow-up in 2 months with Dr.  or Marche Hottenstein with PFT and to discuss sleep study results and treatment plan .

## 2022-12-12 NOTE — Patient Instructions (Addendum)
Set up for home sleep study Healthy sleep regimen Do not drive if sleepy Work on healthy weight loss  Begin Symbicort 80 2 puffs Twice daily   Albuterol inhaler 1-2 puffs every 6hrs as needed for wheezing-this is your rescue inhaler  Zyrtec 10mg  At bedtime  As needed   Follow-up in 2 months with Dr.  or Amarilys Lyles with PFT and to discuss sleep study results and treatment plan .

## 2022-12-12 NOTE — Assessment & Plan Note (Signed)
Reported history of lifelong asthma.  Spirometry-patient did have some difficulty showed severe restriction.  Will restart maintenance regimen with Symbicort.  Went over asthma action plan.  Albuterol inhaler sent to the pharmacy as well.  Will check PFTs on return visit.  Add in Zyrtec for trigger prevention.  Plan  Patient Instructions  Set up for home sleep study Healthy sleep regimen Do not drive if sleepy Work on healthy weight loss  Begin Symbicort 80 2 puffs Twice daily   Albuterol inhaler 1-2 puffs every 6hrs as needed for wheezing-this is your rescue inhaler  Zyrtec 10mg  At bedtime  As needed   Follow-up in 2 months with Dr.  or Rayelle Armor with PFT and to discuss sleep study results and treatment plan .

## 2022-12-12 NOTE — Progress Notes (Signed)
@Patient  ID: , female    DOB: 10/04/1961, 61 y.o.   MRN: [id number]  Chief Complaint  Patient presents with   Consult    Referring provider: [id number]  HPI: 61 year old female never smoker seen for sleep and pulmonary  consult December 12, 2022 for daytime sleepiness, restless sleep and snoring.  Patient typically goes to Medical history significant for coronary artery disease  TEST/EVENTS :   12/12/2022 Sleep consult  Patient presents for a sleep consult today.  Kindly referred by 12/14/2022, PA .  Patient complains of snoring, restless sleep and daytime sleepiness.   No history of congestive heart failure or stroke.  She is followed by cardiology has a history of coronary artery disease. Typically goes to bed at 11 PM, takes 30 minutes to go to sleep.  Is up a few times at night.  Gets up at 7-8 AM.  No symptoms suspicious for cataplexy or sleep paralysis.  Does not take any sleep aids.  Caffeine intake 2-3 cups daily .  Has partial dental plates.  Patient says she had a previous sleep study in 2014 and believes that she was told that she had sleep apnea but does not remember how severe and did not have any follow-up.  Patient says she takes a nap occasionally.  She wakes up feeling tired and unrefreshed.  This has been worse for the last 3 months.  She has been told that she has snored in the past. Epworth score is 1 out of 24.  Typically gets sleepy in the evening hours.  Patient says she is a history of lifelong asthma even as a child.  She has chronic allergies with postnasal drainage.  Previous is taken Advair and Zyrtec.  Also had albuterol inhaler.  She says she does not have a primary care provider currently.  Has been out of all of her asthma medicine for almost a year.  Patient says she did not like Advair before because it was a powder and made her throat hurt.  Patient says that she does have occasional cough and wheezing.  Patient says she is not  very active.  She does have 2 dogs at home.  Denies any birds or chickens.  No hot tub or basement. Patient had difficulty doing spirometry today in the office showed severe restriction with FEV1 at 38%, ratio 94, FVC 40%, decreased mid flows.  Unable to do exhaled nitric oxide testing.   Medical history significant for hypertension, coronary artery disease, asthma, chronic allergies Diastolic heart failure, hyperlipidemia Surgical history positive for tonsillectomy  Social history patient is divorced.  She lives alone.  She is unemployed.  She has a 2 adult children.  She does have 2 dogs.  She is a never smoker.  Denies alcohol or drug use.  Family history positive for stroke, diabetes, emphysema, leukemia       Allergies  Allergen Reactions   Lisinopril Swelling   Losartan Swelling   Mangifera Indica    Amoxicillin Hives, Diarrhea and Rash   Poison Oak Extract    Penicillins Hives and Rash    Immunization History  Administered Date(s) Administered   Influenza, Seasonal, Injecte, Preservative Fre 10/30/2013   Influenza,inj,Quad PF,6+ Mos 01/16/2019   PFIZER(Purple Top)SARS-COV-2 Vaccination 03/24/2020, 04/14/2020   Pneumococcal Polysaccharide-23 10/30/2013   Tdap 07/21/2019    Past Medical History:  Diagnosis Date   Asthma    Coronary artery disease    Hyperlipidemia    Hypertension  MI, acute, non ST segment elevation (HCC) 2020    Tobacco History: Social History   Tobacco Use  Smoking Status Never   Passive exposure: Past  Smokeless Tobacco Never   Counseling given: Not Answered   Outpatient Medications Prior to Visit  Medication Sig Dispense Refill   acetaminophen (TYLENOL) 325 MG tablet Take 650 mg by mouth every 6 (six) hours as needed for headache or mild pain.     albuterol (VENTOLIN HFA) 108 (90 Base) MCG/ACT inhaler Inhale 2 puffs into the lungs every 6 (six) hours as needed for wheezing or shortness of breath. 8 g 2   amLODipine (NORVASC) 5 MG  tablet Take 1 tablet (5 mg total) by mouth daily. 30 tablet 11   aspirin EC 81 MG tablet Take 1 tablet (81 mg total) by mouth daily. Swallow whole. 30 tablet 12   atorvastatin (LIPITOR) 80 MG tablet Take 1 tablet (80 mg total) by mouth daily. 30 tablet 1   bismuth subsalicylate (PEPTO BISMOL) 262 MG chewable tablet Chew 524 mg by mouth as needed. For reflux     carvedilol (COREG) 25 MG tablet Take 1 tablet (25 mg total) by mouth 2 (two) times daily with a meal. 60 tablet 11   diphenhydrAMINE (BENADRYL) 25 mg capsule Take 25 mg by mouth every 6 (six) hours as needed.     hydrALAZINE (APRESOLINE) 50 MG tablet Take 1 tablet (50 mg total) by mouth every 8 (eight) hours. 90 tablet 11   ibuprofen (ADVIL) 200 MG tablet Take 400 mg by mouth every 6 (six) hours as needed for moderate pain.     nitroGLYCERIN (NITROSTAT) 0.4 MG SL tablet Place 1 tablet (0.4 mg total) under the tongue every 5 (five) minutes as needed for chest pain. 25 tablet 3   No facility-administered medications prior to visit.     Review of Systems:   Constitutional:   No  weight loss, night sweats,  Fevers, chills,  +fatigue, or  lassitude.  HEENT:   No headaches,  Difficulty swallowing,  Tooth/dental problems, or  Sore throat,                No sneezing, itching, ear ache,  +nasal congestion, post nasal drip,   CV:  No chest pain,  Orthopnea, PND, swelling in lower extremities, anasarca, dizziness, palpitations, syncope.   GI  No heartburn, indigestion, abdominal pain, nausea, vomiting, diarrhea, change in bowel habits, loss of appetite, bloody stools.   Resp:   No chest wall deformity  Skin: no rash or lesions.  GU: no dysuria, change in color of urine, no urgency or frequency.  No flank pain, no hematuria   MS:  No joint pain or swelling.  No decreased range of motion.  No back pain.    Physical Exam  BP (!) 130/90 (BP Location: Left Arm, Patient Position: Sitting, Cuff Size: Large)   Pulse (!) 106   Temp 97.7  F (36.5 C) (Oral)   Ht 5\' 5"  (1.651 m)   Wt 193 lb 6.4 oz (87.7 kg)   SpO2 98%   BMI 32.18 kg/m   GEN: A/Ox3; pleasant , NAD, well nourished    HEENT:  [place]/AT,  EACs-clear, TMs-wnl, NOSE-clear, THROAT-clear, no lesions, no postnasal drip or exudate noted. Class 3 MP airway   NECK:  Supple w/ fair ROM; no JVD; normal carotid impulses w/o bruits; no thyromegaly or nodules palpated; no lymphadenopathy.    RESP  Clear  P & A; w/o, wheezes/ rales/ or  rhonchi. no accessory muscle use, no dullness to percussion  CARD:  RRR, no m/r/g, no peripheral edema, pulses intact, no cyanosis or clubbing.  GI:   Soft & nt; nml bowel sounds; no organomegaly or masses detected.   Musco: Warm bil, no deformities or joint swelling noted.   Neuro: alert, no focal deficits noted.    Skin: Warm, no lesions or rashes    Lab Results:  CBC    ProBNP No results found for: "PROBNP"  Imaging: No results found.        No data to display          No results found for: "NITRICOXIDE"      Assessment & Plan:   Daytime sleepiness Daytime sleepiness, restless sleep, snoring, possible previous diagnosis of sleep apnea all concerning for underlying ongoing sleep apnea. Will set patient up for home sleep study.  Patient education given on sleep apnea  - discussed how weight can impact sleep and risk for sleep disordered breathing - discussed options to assist with weight loss: combination of diet modification, cardiovascular and strength training exercises   - had an extensive discussion regarding the adverse health consequences related to untreated sleep disordered breathing - specifically discussed the risks for hypertension, coronary artery disease, cardiac dysrhythmias, cerebrovascular disease, and diabetes - lifestyle modification discussed   - discussed how sleep disruption can increase risk of accidents, particularly when driving - safe driving practices were discussed   Plan   Patient Instructions  Set up for home sleep study Healthy sleep regimen Do not drive if sleepy Work on healthy weight loss  Begin Symbicort 80 2 puffs Twice daily   Albuterol inhaler 1-2 puffs every 6hrs as needed for wheezing-this is your rescue inhaler  Zyrtec 10mg  At bedtime  As needed   Follow-up in 2 months with Dr.  or Mykeal Carrick with PFT and to discuss sleep study results and treatment plan .      Asthma Reported history of lifelong asthma.  Spirometry-patient did have some difficulty showed severe restriction.  Will restart maintenance regimen with Symbicort.  Went over asthma action plan.  Albuterol inhaler sent to the pharmacy as well.  Will check PFTs on return visit.  Add in Zyrtec for trigger prevention.  Plan  Patient Instructions  Set up for home sleep study Healthy sleep regimen Do not drive if sleepy Work on healthy weight loss  Begin Symbicort 80 2 puffs Twice daily   Albuterol inhaler 1-2 puffs every 6hrs as needed for wheezing-this is your rescue inhaler  Zyrtec 10mg  At bedtime  As needed   Follow-up in 2 months with Dr. Craige Cotta  or Carolyn Sylvia with PFT and to discuss sleep study results and treatment plan .        , NP 12/12/2022

## 2022-12-13 ENCOUNTER — Ambulatory Visit: Payer: 59 | Attending: Adult Health

## 2022-12-17 NOTE — Progress Notes (Signed)
Reviewed and agree with assessment/plan.   Morrisa Aldaba, MD  Pulmonary/Critical Care 12/17/2022, 7:06 PM Pager:  [phone]  

## 2022-12-29 ENCOUNTER — Ambulatory Visit: Payer: Medicaid Other | Attending: Adult Health

## 2022-12-29 DIAGNOSIS — J45909 Unspecified asthma, uncomplicated: Secondary | ICD-10-CM | POA: Insufficient documentation

## 2022-12-29 LAB — PULMONARY FUNCTION TEST ARMC ONLY
DL/VA % pred: 106 %
DL/VA: 4.48 ml/min/mmHg/L
DLCO unc % pred: 96 %
DLCO unc: 20.36 ml/min/mmHg
FEF 25-75 Post: 1.38 L/sec
FEF 25-75 Pre: 1.08 L/sec
FEF2575-%Change-Post: 28 %
FEF2575-%Pred-Post: 56 %
FEF2575-%Pred-Pre: 43 %
FEV1-%Change-Post: 34 %
FEV1-%Pred-Post: 63 %
FEV1-%Pred-Pre: 47 %
FEV1-Post: 1.69 L
FEV1-Pre: 1.26 L
FEV1FVC-%Change-Post: 27 %
FEV1FVC-%Pred-Pre: 65 %
FEV6-%Change-Post: 1 %
FEV6-%Pred-Post: 75 %
FEV6-%Pred-Pre: 74 %
FEV6-Post: 2.51 L
FEV6-Pre: 2.47 L
FEV6FVC-%Pred-Post: 103 %
FEV6FVC-%Pred-Pre: 103 %
FVC-%Change-Post: 5 %
FVC-%Pred-Post: 75 %
FVC-%Pred-Pre: 71 %
FVC-Post: 2.61 L
FVC-Pre: 2.47 L
Post FEV1/FVC ratio: 65 %
Post FEV6/FVC ratio: 100 %
Pre FEV1/FVC ratio: 51 %
Pre FEV6/FVC Ratio: 100 %
RV % pred: 122 %
RV: 2.47 L
TLC % pred: 98 %
TLC: 5.13 L

## 2022-12-29 MED ORDER — ALBUTEROL SULFATE (2.5 MG/3ML) 0.083% IN NEBU
2.5000 mg | INHALATION_SOLUTION | Freq: Once | RESPIRATORY_TRACT | Status: AC
  Administered 2022-12-29: 2.5 mg via RESPIRATORY_TRACT

## 2023-01-15 ENCOUNTER — Encounter: Payer: Self-pay | Admitting: Medical

## 2023-01-15 ENCOUNTER — Ambulatory Visit: Payer: Medicaid Other | Attending: Medical | Admitting: Medical

## 2023-01-15 VITALS — BP 116/68 | HR 82 | Ht 66.0 in | Wt 182.0 lb

## 2023-01-15 DIAGNOSIS — I2510 Atherosclerotic heart disease of native coronary artery without angina pectoris: Secondary | ICD-10-CM

## 2023-01-15 DIAGNOSIS — E782 Mixed hyperlipidemia: Secondary | ICD-10-CM

## 2023-01-15 DIAGNOSIS — I10 Essential (primary) hypertension: Secondary | ICD-10-CM

## 2023-01-15 DIAGNOSIS — I5032 Chronic diastolic (congestive) heart failure: Secondary | ICD-10-CM | POA: Diagnosis not present

## 2023-01-15 DIAGNOSIS — G4733 Obstructive sleep apnea (adult) (pediatric): Secondary | ICD-10-CM

## 2023-01-15 DIAGNOSIS — Z9861 Coronary angioplasty status: Secondary | ICD-10-CM

## 2023-01-15 NOTE — Patient Instructions (Signed)
Medication Instructions:  -Your physician recommends that you continue on your current medications as directed. Please refer to the Current Medication list given to you today.  *If you need a refill on your cardiac medications before your next appointment, please call your pharmacy*   Lab Work: - none ordered  If you have labs (blood work) drawn today and your tests are completely normal, you will receive your results only by: [place] (if you have MyChart) OR A paper copy in the mail If you have any lab test that is abnormal or we need to change your treatment, we will call you to review the results.   Testing/Procedures: - none ordered   Follow-Up: At [facility], you and your health needs are our priority.  As part of our continuing mission to provide you with exceptional heart care, we have created designated Provider Care Teams.  These Care Teams include your primary Cardiologist (physician) and Advanced Practice Providers (APPs -  Physician Assistants and Nurse Practitioners) who all work together to provide you with the care you need, when you need it.  We recommend signing up for the patient portal called "MyChart".  Sign up information is provided on this After Visit Summary.  MyChart is used to connect with patients for Virtual Visits (Telemedicine).  Patients are able to view lab/test results, encounter notes, upcoming appointments, etc.  Non-urgent messages can be sent to your provider as well.   To learn more about what you can do with MyChart, go to [email or web address].    Your next appointment:   6 month(s)  Provider:   You may see Kathlyn Sacramento, MD or one of the following Advanced Practice Providers on your designated Care Team:    Cadence Kathlen Mody, [place]    Other Instructions N/a

## 2023-01-15 NOTE — Progress Notes (Signed)
Cardiology Office Note:    Date:  01/15/2023   ID:  Donna Prince, DOB [birth year], MRN [id number]  PCP:  Alfonse Flavors, MD  [facility] Cardiologist:  None  [facility] Electrophysiologist:  None   Referring MD: Alfonse Flavors,*   Chief Complaint: 1 month follow-up  History of Present Illness:    Donna Prince is a 62 y.o. female with a hx of CAD status post DES to OM2 in 2020, OSA, hypertension, and hyperlipidemia who is being seen for hospital follow-up.   Patient was previous seen by [facility] cardiology.    January 2020 patient presented with chest pain found to have STEMI.  Cath showed 99% mid OM 2 lesion treated with DES.  Echo showed normal LV function.  She has allergy to lisinopril with angioedema.  She tolerates losartan.  She was started on DAPT with aspirin and prasugrel.   In 06/13/2020 the patient presented with acute chest pressure and dyspnea in the setting of being off her DAPT for 2 weeks.  She was admitted for non-STEMI/scad.  PCP has switched prasugrel to Plavix.  Unfortunately, she had stopped her prasugrel and not taking her Plavix.  Troponin peaked at 9.8.  She underwent left heart cath on 628 that showed very tortuous coronary arteries, ostial 70% stenosis of small first diagonal, TIMI-3 flow in the previously stented marginal,, concerns for possible scad of a small ramus branch.  Medical management was recommended. Echo showed LVEF>55%. She was discharged on aspirin, Plavix and home metoprolol.    Patient presented to [facility] ED 10/20/2022 with chest pain.  Her blood pressure was 180/107, pulse rate 107 bpm.  High-sensitivity troponin was 31 and 574.  CT negative for PE.  EKG showed sinus tach.  She was given aspirin, nitroglycerin, started on IV heparin.  She reported she had not taken her cardiac medications in months.  Echo showed LVEF 60 to 65%, no wall motion abnormalities, grade 1 diastolic dysfunction, mildly reduced RV function, moderately  dilated by atrium, mild MR, mild to moderate TR.  Cardiac cath showed patent LM stent with mild nonobstructive CAD.  Suspect elevated troponin due to supply demand mismatch in the setting of uncontrolled hypertension.  Patient was re-started on Coreg, amlodipine, and hydralazine.  The patient was last seen 11/29/22 and reported possible food poisoning. She denies anginal symptoms. BP was stable on Hydralazine, amlodipine and Coreg. The patient was referred to pulmonology for OSA work-up.  Today, the patient is doing really well. She is sleeping better. All her GI issues have resolved. BP is good today. She denies chest pain and shortness of breath. No lower leg edema. She has been eating low salt diet. She has not heard from Pulmonology, she is going to do sleep study.    Past Medical History:  Diagnosis Date   Asthma    Coronary artery disease    Hyperlipidemia    Hypertension    MI, acute, non ST segment elevation ([place]) 2020    Past Surgical History:  Procedure Laterality Date   CARDIAC CATHETERIZATION  2020   x2 stents   LEFT HEART CATH AND CORONARY ANGIOGRAPHY N/A 10/23/2022   Procedure: LEFT HEART CATH AND CORONARY ANGIOGRAPHY;  Surgeon: Wellington Hampshire, MD;  Location: [facility];  Service: Cardiovascular;  Laterality: N/A;    Current Medications: Current Meds  Medication Sig   acetaminophen (TYLENOL) 325 MG tablet Take 650 mg by mouth every 6 (six) hours as needed for headache or  mild pain.   albuterol (VENTOLIN HFA) 108 (90 Base) MCG/ACT inhaler Inhale 2 puffs into the lungs every 6 (six) hours as needed for wheezing or shortness of breath.   amLODipine (NORVASC) 5 MG tablet Take 1 tablet (5 mg total) by mouth daily.   aspirin EC 81 MG tablet Take 1 tablet (81 mg total) by mouth daily. Swallow whole.   atorvastatin (LIPITOR) 80 MG tablet Take 1 tablet (80 mg total) by mouth daily.   bismuth subsalicylate (PEPTO BISMOL) 262 MG chewable tablet Chew 524 mg by mouth as  needed. For reflux   budesonide-formoterol (SYMBICORT) 80-4.5 MCG/ACT inhaler Inhale 2 puffs into the lungs 2 (two) times daily.   carvedilol (COREG) 25 MG tablet Take 1 tablet (25 mg total) by mouth 2 (two) times daily with a meal.   diphenhydrAMINE (BENADRYL) 25 mg capsule Take 25 mg by mouth every 6 (six) hours as needed.   hydrALAZINE (APRESOLINE) 50 MG tablet Take 1 tablet (50 mg total) by mouth every 8 (eight) hours.   ibuprofen (ADVIL) 200 MG tablet Take 400 mg by mouth every 6 (six) hours as needed for moderate pain.   nitroGLYCERIN (NITROSTAT) 0.4 MG SL tablet Place 1 tablet (0.4 mg total) under the tongue every 5 (five) minutes as needed for chest pain.     Allergies:   Lisinopril, Losartan, Mangifera indica, Amoxicillin, Poison oak extract, and Penicillins   Social History   Socioeconomic History   Marital status: Married    Spouse name: Not on file   Number of children: Not on file   Years of education: Not on file   Highest education level: Not on file  Occupational History   Not on file  Tobacco Use   Smoking status: Never    Passive exposure: Past   Smokeless tobacco: Never  Vaping Use   Vaping Use: Never used  Substance and Sexual Activity   Alcohol use: Never   Drug use: Never   Sexual activity: Not Currently  Other Topics Concern   Not on file  Social History Narrative   Not on file   Social Determinants of Health   Financial Resource Strain: Not on file  Food Insecurity: No Food Insecurity (10/21/2022)   Hunger Vital Sign    Worried About Running Out of Food in the Last Year: Never true    Ran Out of Food in the Last Year: Never true  Transportation Needs: No Transportation Needs (10/21/2022)   PRAPARE - Administrator, Civil Service (Medical): No    Lack of Transportation (Non-Medical): No  Physical Activity: Not on file  Stress: Not on file  Social Connections: Not on file     Family History: The patient's family history is not on  file.  ROS:   Please see the history of present illness.     All other systems reviewed and are negative.  EKGs/Labs/Other Studies Reviewed:    The following studies were reviewed today:  Cardiac cath 09/2022       Mid Cx lesion is 20% stenosed.   Previously placed 1st Mrg stent of unknown type is  widely patent.   The left ventricular systolic function is normal.   LV end diastolic pressure is mildly elevated.   The left ventricular ejection fraction is 55-65% by visual estimate.   1.  Widely patent OM stent with no significant restenosis.  Tortuous coronary arteries with mild nonobstructive coronary artery disease. 2.  Normal LV systolic function.  Mildly elevated  left ventricular end-diastolic pressure.   Recommendations: Elevated troponin is likely due to supply demand mismatch in the setting of uncontrolled hypertension. Recommend continuing medical therapy.   Coronary Diagrams   Diagnostic Dominance: Right  Intervention     Echo 10/21/22 1. Left ventricular ejection fraction, by estimation, is 60 to 65%. The  left ventricle has normal function. The left ventricle has no regional  wall motion abnormalities. The left ventricular internal cavity size was  mildly dilated. There is severe  concentric left ventricular hypertrophy. Left ventricular diastolic  parameters are consistent with Grade I diastolic dysfunction (impaired  relaxation).   2. Right ventricular systolic function is mildly reduced. The right  ventricular size is mildly enlarged. Mildly increased right ventricular  wall thickness. There is normal pulmonary artery systolic pressure.   3. Left atrial size was moderately dilated.   4. Right atrial size was moderately dilated.   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 Mild mitral valve regurgitation.   6. Tricuspid valve regurgitation is mild to moderate.   7. The aortic valve is calcified. Aortic valve regurgitation is trivial.  Aortic valve  sclerosis/calcification is present, without any evidence of  aortic stenosis.  EKG:  EKG is not ordered today.    Recent Labs: 10/21/2022: B Natriuretic Peptide 200.0; TSH 0.034 10/22/2022: ALT 18; BUN 18; Creatinine, Ser 0.67; Potassium 4.2; Sodium 138 10/23/2022: Hemoglobin 12.4; Platelets 172  Recent Lipid Panel    Component Value Date/Time   CHOL 183 10/22/2022 0439   TRIG 87 10/22/2022 0439   HDL 55 10/22/2022 0439   CHOLHDL 3.3 10/22/2022 0439   VLDL 17 10/22/2022 0439   LDLCALC 111 (H) 10/22/2022 0439     Physical Exam:    VS:  BP 116/68 (BP Location: Left Arm, Patient Position: Sitting, Cuff Size: Normal)   Pulse 82   Ht 5\' 6"  (1.676 m)   Wt 182 lb (82.6 kg)   SpO2 98%   BMI 29.38 kg/m     Wt Readings from Last 3 Encounters:  01/15/23 182 lb (82.6 kg)  12/12/22 193 lb 6.4 oz (87.7 kg)  11/29/22 186 lb 6 oz (84.5 kg)     GEN:  Well nourished, well developed in no acute distress HEENT: Normal NECK: No JVD; No carotid bruits LYMPHATICS: No lymphadenopathy CARDIAC: RRR, no murmurs, rubs, gallops RESPIRATORY:  Clear to auscultation without rales, wheezing or rhonchi  ABDOMEN: Soft, non-tender, non-distended MUSCULOSKELETAL:  No edema; No deformity  SKIN: Warm and dry NEUROLOGIC:  Alert and oriented x 3 PSYCHIATRIC:  Normal affect   ASSESSMENT:    1. CAD S/P percutaneous coronary angioplasty   2. Chronic diastolic heart failure ([place])   3. Essential hypertension   4. Hyperlipidemia, mixed   5. OSA (obstructive sleep apnea)    PLAN:    In order of problems listed above:  CAD s/p DES OM in 2020 Most recent cath in 09/2022 showed patent OM stent with otherwise nonobstructive CAD. Patient denies anginal symptoms. No further ischemic work-up indicated. Continue ASA, Coreg, Lipitor, and SL NTG.   Diastolic Dysfunction Echo showed normal LVEF, severe LVH and G1DD. Patient is euvolemic on exam. Continue Coreg. She is eating low salt diet. No further changes  at this point.   HTN BP is good today, continue Hydralazine, Amlodipine and Coreg.   HLD LDL 111. Continue Lipitor 80mg  daily.   OSA Patient saw pulmonology and is planning on home sleep study.   Disposition: Follow up in 6 month(s) with MD/APP  Signed, Ceasar Decandia David Stall, PA-C  01/15/2023 2:20 PM    [facility]

## 2023-02-09 ENCOUNTER — Telehealth: Payer: Self-pay | Admitting: Cardiovascular Disease

## 2023-02-09 MED ORDER — ATORVASTATIN CALCIUM 80 MG PO TABS: 80.0000 mg | ORAL_TABLET | Freq: Every day | ORAL | 3 refills | Status: DC

## 2023-02-09 NOTE — Addendum Note (Signed)
Addended by: Michel Santee on: 02/09/2023 11:39 AM   Modules accepted: Orders

## 2023-02-09 NOTE — Telephone Encounter (Signed)
*  STAT* If patient is at the pharmacy, call can be transferred to refill team.   1. Which medications need to be refilled? (please list name of each medication and dose if known) Atorvastatin 72m   2. Which pharmacy/location (including street and city if local pharmacy) is medication to be sent to? [facility]  3. Do they need a 30 day or 90 day supply? 90day

## 2023-02-21 ENCOUNTER — Other Ambulatory Visit (HOSPITAL_COMMUNITY): Payer: Self-pay

## 2023-03-13 ENCOUNTER — Other Ambulatory Visit: Payer: Self-pay

## 2023-03-14 ENCOUNTER — Other Ambulatory Visit: Payer: Self-pay

## 2023-03-14 ENCOUNTER — Other Ambulatory Visit (HOSPITAL_COMMUNITY): Payer: Self-pay

## 2023-04-02 ENCOUNTER — Telehealth: Payer: Self-pay

## 2023-04-02 NOTE — Telephone Encounter (Signed)
Tried to call pt to see if she would be interested in the prevail research study- No answer, LMOM. Asked her to return call if she was interested.

## 2023-04-09 NOTE — Progress Notes (Signed)
ATC x2.  LVM to return call to schedule f/u OV to discuss PFT results.  It looks like she has not had the HST done, she was contacted and VM left for patient.   4/4-order placed in [facility]//hp 2/26 LVM for patient to call & sce/asw 2/22 LVM for patient to call & sche/asw Ready to sche/asw

## 2023-04-25 ENCOUNTER — Telehealth: Payer: Self-pay | Admitting: Adult Health

## 2023-04-25 NOTE — Telephone Encounter (Signed)
ATC the patient. LVM for the patient to return my call. 

## 2023-04-25 NOTE — Telephone Encounter (Signed)
Pt is requesting a rx for nebulizer machine and meds [facility] [address]

## 2023-04-26 NOTE — Telephone Encounter (Signed)
I tried to contact the patient. The phone rang and then said my call could not be completed as dialed. I have mailed her a letter to contact our office.  Nothing further needed.

## 2023-05-09 ENCOUNTER — Telehealth: Payer: Self-pay

## 2023-05-09 NOTE — Telephone Encounter (Signed)
..  Patient declines further follow up and engagement by the Managed Medicaid Team. Appropriate care team members and provider have been notified via electronic communication. The Managed Medicaid Team is available to follow up with the patient after provider conversation with the patient regarding recommendation for engagement and subsequent re-referral to the Managed Medicaid Team.     Jacynda Brunke Care Guide  Medicaid Managed  Care Guide [place]  [phone]  

## 2023-05-15 ENCOUNTER — Other Ambulatory Visit: Payer: Self-pay | Admitting: Student in an Organized Health Care Education/Training Program

## 2023-05-15 ENCOUNTER — Ambulatory Visit (INDEPENDENT_AMBULATORY_CARE_PROVIDER_SITE_OTHER): Payer: Medicaid Other | Admitting: Student in an Organized Health Care Education/Training Program

## 2023-05-15 VITALS — BP 142/78 | HR 70 | Temp 97.7°F | Ht 66.0 in | Wt 189.0 lb

## 2023-05-15 DIAGNOSIS — M542 Cervicalgia: Secondary | ICD-10-CM | POA: Diagnosis not present

## 2023-05-15 DIAGNOSIS — M25511 Pain in right shoulder: Secondary | ICD-10-CM | POA: Diagnosis not present

## 2023-05-15 DIAGNOSIS — J45909 Unspecified asthma, uncomplicated: Secondary | ICD-10-CM | POA: Diagnosis not present

## 2023-05-15 DIAGNOSIS — M5412 Radiculopathy, cervical region: Secondary | ICD-10-CM | POA: Diagnosis not present

## 2023-05-15 MED ORDER — AEROCHAMBER MV MISC: 0 refills | Status: AC

## 2023-05-15 MED ORDER — BUDESONIDE-FORMOTEROL FUMARATE 160-4.5 MCG/ACT IN AERO: 2.0000 | INHALATION_SPRAY | Freq: Two times a day (BID) | RESPIRATORY_TRACT | 12 refills | Status: DC

## 2023-05-15 MED ORDER — FLUTICASONE PROPIONATE 50 MCG/ACT NA SUSP: 1.0000 | Freq: Every day | NASAL | 11 refills | Status: DC

## 2023-05-15 NOTE — Patient Instructions (Signed)
Today, we increased the dose of your inhaler. It will be Symbicort, and I want you to use it two puffs twice daily, every day. We also added a nasal spray to be used once daily Today, I also ordered blood work. You can get them draw at your preferred [facility] draw station. The nearest one to [facility] is at nearby [facility] ([address] [place], [place], [place] [address]).

## 2023-05-15 NOTE — Progress Notes (Signed)
Synopsis: Referred in for asthma by Zhou-Talbert, Ralene Bathe,*  Assessment & Plan:   #Moderate Intermittent Asthma  Patient with a history of asthma, with recent PFT's showing notable obstruction (FEV1 1.26L - 47%predicted, post 1.69L - 63% predicted) overall consistent with asthma. She was started on Symbicort 80-4.5 and is not very compliant with using it twice daily. We were unable to perform FENO today in clinic.  I will step up her therapy to 160-4.5 two puffs twice daily, and have asked her to use it twice daily. I will also order a spacer device to help with delivery and have instructed her to wash her mouth after every use. Will add intra-nasal steroids to her regimen, and will assess for t-helper cell response with an allergen panel and a CBC with differential.  - Allergen Panel (27) + IGE - CBC with Differential/Platelet - fluticasone (FLONASE) 50 MCG/ACT nasal spray; Place 1 spray into both nostrils daily.  Dispense: 18.2 mL; Refill: 11 - budesonide-formoterol (SYMBICORT) 160-4.5 MCG/ACT inhaler; Inhale 2 puffs into the lungs in the morning and at bedtime.  Dispense: 1 each; Refill: 12   Return in about 3 months (around 08/15/2023).  I spent 45 minutes caring for this patient today, including preparing to see the patient, obtaining a medical history , reviewing a separately obtained history, performing a medically appropriate examination and/or evaluation, counseling and educating the patient/family/caregiver, ordering medications, tests, or procedures, documenting clinical information in the electronic health record, and independently interpreting results (not separately reported/billed) and communicating results to the patient/family/caregiver  Raechel Chute, MD [facility] 05/15/2023 2:26 PM    End of visit medications:  Meds ordered this encounter  Medications   fluticasone (FLONASE) 50 MCG/ACT nasal spray    Sig: Place 1 spray into both nostrils  daily.    Dispense:  18.2 mL    Refill:  11   budesonide-formoterol (SYMBICORT) 160-4.5 MCG/ACT inhaler    Sig: Inhale 2 puffs into the lungs in the morning and at bedtime.    Dispense:  1 each    Refill:  12   Spacer/Aero-Holding Chambers (AEROCHAMBER MV) inhaler    Sig: Use as instructed    Dispense:  1 each    Refill:  0     Current Outpatient Medications:    acetaminophen (TYLENOL) 325 MG tablet, Take 650 mg by mouth every 6 (six) hours as needed for headache or mild pain., Disp: , Rfl:    albuterol (VENTOLIN HFA) 108 (90 Base) MCG/ACT inhaler, Inhale 2 puffs into the lungs every 6 (six) hours as needed for wheezing or shortness of breath., Disp: 8 g, Rfl: 2   amLODipine (NORVASC) 5 MG tablet, Take 1 tablet (5 mg total) by mouth daily., Disp: 30 tablet, Rfl: 11   aspirin EC 81 MG tablet, Take 1 tablet (81 mg total) by mouth daily. Swallow whole., Disp: 30 tablet, Rfl: 12   atorvastatin (LIPITOR) 80 MG tablet, Take 1 tablet (80 mg total) by mouth daily., Disp: 30 tablet, Rfl: 3   bismuth subsalicylate (PEPTO BISMOL) 262 MG chewable tablet, Chew 524 mg by mouth as needed. For reflux, Disp: , Rfl:    budesonide-formoterol (SYMBICORT) 160-4.5 MCG/ACT inhaler, Inhale 2 puffs into the lungs in the morning and at bedtime., Disp: 1 each, Rfl: 12   carvedilol (COREG) 25 MG tablet, Take 1 tablet (25 mg total) by mouth 2 (two) times daily with a meal., Disp: 60 tablet, Rfl: 11   diphenhydrAMINE (BENADRYL) 25 mg capsule,  Take 25 mg by mouth every 6 (six) hours as needed., Disp: , Rfl:    fluticasone (FLONASE) 50 MCG/ACT nasal spray, Place 1 spray into both nostrils daily., Disp: 18.2 mL, Rfl: 11   hydrALAZINE (APRESOLINE) 50 MG tablet, Take 1 tablet (50 mg total) by mouth every 8 (eight) hours., Disp: 90 tablet, Rfl: 11   ibuprofen (ADVIL) 200 MG tablet, Take 400 mg by mouth every 6 (six) hours as needed for moderate pain., Disp: , Rfl:    Spacer/Aero-Holding Chambers (AEROCHAMBER MV) inhaler, Use  as instructed, Disp: 1 each, Rfl: 0   nitroGLYCERIN (NITROSTAT) 0.4 MG SL tablet, Place 1 tablet (0.4 mg total) under the tongue every 5 (five) minutes as needed for chest pain., Disp: 25 tablet, Rfl: 3   Subjective:   PATIENT ID: Donna Prince GENDER: female DOB: 1961/05/28, MRN: [id number]  Chief Complaint  Patient presents with   Follow-up    PFT 12/2022- occ SOB with exertion and occ wheezing.    HPI  Patient is a pleasant 62 year old female with a past medical history of asthma, last seen in our clinic by NP Parrett who is presenting today for follow up.  Patient was last seen in clinic in December, and PFT's were ordered. These showed notable obstructive lung disease with significant reversibility. She was prescribed Symbicort that she's been using once a day on average (forgetting to use it on some days). She feels that the symptoms are much improved with the inhaler. Her shortness of breath, cough, and wheeze are all improved. She does not have a wheeze at the moment, and no sputum production. No fevers or chills reported, and no chest pain or chest tightness. Patient does report having a stuffy nose that is more of a chronic ailment.  Patient reports a long standing history of asthma, and had been previously maintained on Advair (while living in [place]). She's originally from [place], but has lived in [place] and [place] in the past. Her asthma was best controlled in [place].  Patient is a lifelong non-smoker, and denies any occupational exposures. 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 She has two dogs, but doesn't feel her symptoms were worse after getting them.  Ancillary information including prior medications, full medical/surgical/family/social histories, and PFTs (when available) are listed below and have been reviewed.   Review of Systems  Constitutional:  Negative for chills, fever and weight loss.  Respiratory:  Positive for  cough, shortness of breath and wheezing.   Cardiovascular:  Negative for chest pain.     Objective:   Vitals:   05/15/23 0901  BP: (!) 142/78  Pulse: 70  Temp: 97.7 F (36.5 C)  TempSrc: Temporal  Weight: 189 lb (85.7 kg)  Height: 5\' 6"  (1.676 m)    BMI Readings from Last 3 Encounters:  05/15/23 30.51 kg/m  01/15/23 29.38 kg/m  12/12/22 32.18 kg/m   Wt Readings from Last 3 Encounters:  05/15/23 189 lb (85.7 kg)  01/15/23 182 lb (82.6 kg)  12/12/22 193 lb 6.4 oz (87.7 kg)    Physical Exam Constitutional:      Appearance: Normal appearance.  Cardiovascular:     Rate and Rhythm: Normal rate and regular rhythm.     Pulses: Normal pulses.     Heart sounds: Normal heart sounds.  Pulmonary:     Effort: Pulmonary effort is normal.     Breath sounds: Normal breath sounds. No wheezing or rales.  Abdominal:     Palpations: Abdomen is soft.  Neurological:     General: No focal deficit present.     Mental Status: She is alert and oriented to person, place, and time. Mental status is at baseline.       Ancillary Information    Past Medical History:  Diagnosis Date   Asthma    Coronary artery disease    Hyperlipidemia    Hypertension    MI, acute, non ST segment elevation (HCC) 2020     No family history on file.   Past Surgical History:  Procedure Laterality Date   CARDIAC CATHETERIZATION  2020   x2 stents   LEFT HEART CATH AND CORONARY ANGIOGRAPHY N/A 10/23/2022   Procedure: LEFT HEART CATH AND CORONARY ANGIOGRAPHY;  Surgeon: Iran Ouch, MD;  Location: [facility] INVASIVE CV LAB;  Service: Cardiovascular;  Laterality: N/A;    Social History   Socioeconomic History   Marital status: Married    Spouse name: Not on file   Number of children: Not on file   Years of education: Not on file   Highest education level: Not on file  Occupational History   Not on file  Tobacco Use   Smoking status: Never    Passive exposure: Past   Smokeless tobacco:  Never  Vaping Use   Vaping Use: Never used  Substance and Sexual Activity   Alcohol use: Never   Drug use: Never   Sexual activity: Not Currently  Other Topics Concern   Not on file  Social History Narrative   Not on file   Social Determinants of Health   Financial Resource Strain: Not on file  Food Insecurity: No Food Insecurity (10/21/2022)   Hunger Vital Sign    Worried About Running Out of Food in the Last Year: Never true    Ran Out of Food in the Last Year: Never true  Transportation Needs: No Transportation Needs (10/21/2022)   PRAPARE - Administrator, Civil Service (Medical): No    Lack of Transportation (Non-Medical): No  Physical Activity: Not on file  Stress: Not on file  Social Connections: Not on file  Intimate Partner Violence: Not At Risk (10/21/2022)   Humiliation, Afraid, Rape, and Kick questionnaire    Fear of Current or Ex-Partner: No    Emotionally Abused: No    Physically Abused: No    Sexually Abused: No     Allergies  Allergen Reactions   Lisinopril Swelling   Losartan Swelling   Mangifera Indica    Amoxicillin Hives, Diarrhea and Rash   Poison Oak Extract    Penicillins Hives and Rash     CBC    Component Value Date/Time   WBC 4.5 10/23/2022 0508   RBC 4.58 10/23/2022 0508   HGB 12.4 10/23/2022 0508   HCT 37.0 10/23/2022 0508   PLT 172 10/23/2022 0508   MCV 80.8 10/23/2022 0508   MCH 27.1 10/23/2022 0508   MCHC 33.5 10/23/2022 0508   RDW 13.5 10/23/2022 0508    Pulmonary Functions Testing Results:    Latest Ref Rng & Units 12/29/2022    4:00 PM  PFT Results  FVC-Pre L 2.47   FVC-Predicted Pre % 71   FVC-Post L 2.61   FVC-Predicted Post % 75   Pre FEV1/FVC % % 51   Post FEV1/FCV % % 65   FEV1-Pre L 1.26   FEV1-Predicted Pre % 47   FEV1-Post L 1.69   DLCO uncorrected ml/min/mmHg  20.36   DLCO UNC% % 96   DLVA Predicted % 106   TLC L 5.13   TLC % Predicted % 98   RV % Predicted % 122     Outpatient  Medications Prior to Visit  Medication Sig Dispense Refill   acetaminophen (TYLENOL) 325 MG tablet Take 650 mg by mouth every 6 (six) hours as needed for headache or mild pain.     albuterol (VENTOLIN HFA) 108 (90 Base) MCG/ACT inhaler Inhale 2 puffs into the lungs every 6 (six) hours as needed for wheezing or shortness of breath. 8 g 2   amLODipine (NORVASC) 5 MG tablet Take 1 tablet (5 mg total) by mouth daily. 30 tablet 11   aspirin EC 81 MG tablet Take 1 tablet (81 mg total) by mouth daily. Swallow whole. 30 tablet 12   atorvastatin (LIPITOR) 80 MG tablet Take 1 tablet (80 mg total) by mouth daily. 30 tablet 3   bismuth subsalicylate (PEPTO BISMOL) 262 MG chewable tablet Chew 524 mg by mouth as needed. For reflux     carvedilol (COREG) 25 MG tablet Take 1 tablet (25 mg total) by mouth 2 (two) times daily with a meal. 60 tablet 11   diphenhydrAMINE (BENADRYL) 25 mg capsule Take 25 mg by mouth every 6 (six) hours as needed.     hydrALAZINE (APRESOLINE) 50 MG tablet Take 1 tablet (50 mg total) by mouth every 8 (eight) hours. 90 tablet 11   ibuprofen (ADVIL) 200 MG tablet Take 400 mg by mouth every 6 (six) hours as needed for moderate pain.     budesonide-formoterol (SYMBICORT) 80-4.5 MCG/ACT inhaler Inhale 2 puffs into the lungs 2 (two) times daily. 1 each 5   nitroGLYCERIN (NITROSTAT) 0.4 MG SL tablet Place 1 tablet (0.4 mg total) under the tongue every 5 (five) minutes as needed for chest pain. 25 tablet 3   albuterol (VENTOLIN HFA) 108 (90 Base) MCG/ACT inhaler Inhale 1-2 puffs into the lungs every 6 (six) hours as needed. (Patient not taking: Reported on 01/15/2023) 8 g 2   No facility-administered medications prior to visit.

## 2023-05-18 LAB — CBC WITH DIFFERENTIAL/PLATELET
Basophils Absolute: 0 10*3/uL (ref 0.0–0.2)
Basos: 0 %
EOS (ABSOLUTE): 0.1 10*3/uL (ref 0.0–0.4)
Eos: 1 %
Hematocrit: 37.3 % (ref 34.0–46.6)
Hemoglobin: 12.1 g/dL (ref 11.1–15.9)
Immature Grans (Abs): 0 10*3/uL (ref 0.0–0.1)
Immature Granulocytes: 0 %
Lymphocytes Absolute: 0.8 10*3/uL (ref 0.7–3.1)
Lymphs: 16 %
MCH: 26.1 pg — ABNORMAL LOW (ref 26.6–33.0)
MCHC: 32.4 g/dL (ref 31.5–35.7)
MCV: 81 fL (ref 79–97)
Monocytes Absolute: 0.3 10*3/uL (ref 0.1–0.9)
Monocytes: 6 %
Neutrophils Absolute: 3.6 10*3/uL (ref 1.4–7.0)
Neutrophils: 77 %
Platelets: 177 10*3/uL (ref 150–450)
RBC: 4.63 x10E6/uL (ref 3.77–5.28)
RDW: 13.3 % (ref 11.7–15.4)
WBC: 4.7 10*3/uL (ref 3.4–10.8)

## 2023-05-18 LAB — ALLERGEN PANEL (27) + IGE
Alternaria Alternata IgE: <0.1 kU/L
Aspergillus Fumigatus IgE: <0.1 kU/L
Bahia Grass IgE: <0.1 kU/L
Bermuda Grass IgE: <0.1 kU/L
Cat Dander IgE: <0.1 kU/L
Cedar, Mountain IgE: <0.1 kU/L
Cladosporium Herbarum IgE: <0.1 kU/L
Cocklebur IgE: 0.39 kU/L — AB
Cockroach, American IgE: <0.1 kU/L
Common Silver Birch IgE: <0.1 kU/L
D Farinae IgE: 0.76 kU/L — AB
D Pteronyssinus IgE: 1.07 kU/L — AB
Dog Dander IgE: 0.17 kU/L — AB
Elm, American IgE: <0.1 kU/L
Hickory, White IgE: <0.1 kU/L
IgE (Immunoglobulin E), Serum: 93 IU/mL (ref 6–495)
Johnson Grass IgE: <0.1 kU/L
Kentucky Bluegrass IgE: 0.23 kU/L — AB
Maple/Box Elder IgE: 0.22 kU/L — AB
Mucor Racemosus IgE: <0.1 kU/L
Oak, White IgE: <0.1 kU/L
Penicillium Chrysogen IgE: <0.1 kU/L
Pigweed, Rough IgE: <0.1 kU/L
Plantain, English IgE: <0.1 kU/L
Ragweed, Short IgE: 0.11 kU/L — AB
Setomelanomma Rostrat: <0.1 kU/L
Timothy Grass IgE: 0.13 kU/L — AB
White Mulberry IgE: <0.1 kU/L

## 2023-05-25 ENCOUNTER — Telehealth: Payer: Self-pay

## 2023-05-25 DIAGNOSIS — Z9109 Other allergy status, other than to drugs and biological substances: Secondary | ICD-10-CM

## 2023-05-25 NOTE — Telephone Encounter (Signed)
Lm x2 for patient.  Letter mailed to address on file.  Will close encounter.

## 2023-05-25 NOTE — Telephone Encounter (Signed)
-----   Message from Raechel Chute, MD sent at 05/23/2023  4:08 PM EDT ----- Patient's allergy testing shows an allergy to dust mites (recommend getting pillow and mattress covers specifically for that). Also showing an allergy to dog dander. Please call the patient with the result, thank you!

## 2023-05-25 NOTE — Addendum Note (Signed)
Addended by: Lajoyce Lauber A on: 05/25/2023 10:59 AM   Modules accepted: Orders

## 2023-05-25 NOTE — Telephone Encounter (Addendum)
Spoke to patient.  She is aware of results and voiced her understanding.  She would like referral to allergist.  Dr. Aundria Rud, please advise.

## 2023-05-25 NOTE — Telephone Encounter (Signed)
Referral placed. Patient is aware and voiced her understanding.  Nothing further needed.   

## 2023-06-04 ENCOUNTER — Telehealth: Payer: Self-pay | Admitting: Adult Health

## 2023-06-04 NOTE — Telephone Encounter (Signed)
Patient called and left a message on my phone stating she was going through her messages and saw that I had called wanting to scheduled her home sleep study.  I hadn't tried to call the patient since 01/2023, then [facility] sent the order to [facility].  I am now trying to get the HST reapproved to try and get it scheduled.  I really think Delray Alt called her on 05/25/23 about test results and that was who left her a message. This is just to document her call

## 2023-06-07 NOTE — Progress Notes (Signed)
Patient was seen by Dr. Marcha Dutton on 05/15/2023.  Closing encounter.

## 2023-06-08 ENCOUNTER — Encounter: Payer: Self-pay | Admitting: Adult Health

## 2023-07-17 ENCOUNTER — Other Ambulatory Visit: Payer: Self-pay | Admitting: Medical

## 2023-08-28 ENCOUNTER — Ambulatory Visit: Payer: Medicaid Other | Attending: Cardiovascular Disease | Admitting: Cardiovascular Disease

## 2023-09-04 ENCOUNTER — Ambulatory Visit (INDEPENDENT_AMBULATORY_CARE_PROVIDER_SITE_OTHER): Payer: Medicaid Other | Admitting: Student in an Organized Health Care Education/Training Program

## 2023-09-04 ENCOUNTER — Ambulatory Visit: Payer: Medicaid Other | Admitting: Pediatrics

## 2023-09-04 ENCOUNTER — Encounter: Payer: Self-pay | Admitting: Student in an Organized Health Care Education/Training Program

## 2023-09-04 VITALS — BP 130/80 | HR 83 | Temp 97.6°F | Ht 66.0 in | Wt 190.8 lb

## 2023-09-04 DIAGNOSIS — J45909 Unspecified asthma, uncomplicated: Secondary | ICD-10-CM | POA: Diagnosis not present

## 2023-09-04 DIAGNOSIS — Z23 Encounter for immunization: Secondary | ICD-10-CM | POA: Diagnosis not present

## 2023-09-04 NOTE — Progress Notes (Signed)
Synopsis: Follow up for asthma  Assessment & Plan:   #Moderate Intermittent Asthma   Patient with a history of asthma, with recent PFT's showing notable obstruction (FEV1 1.26L - 47%predicted, post 1.69L - 63% predicted) overall consistent with asthma. She was started on Symbicort, stepped up to 160-4.5 two puffs bid during our last visit and with notable improvement in symptoms.  I have asked the patient to continue using Symbicort as she is, and to also use it PRN in addition to BID per GINA recommendations. We did discuss labwork that showed allergies to dust mites, dogs, and pollen. I recommended keeping her dogs out of the bedroom and also working on dust mite mitigation. She will continue intra-nasal steroids which she's found quite helpful.  -Continue Symbicort two puffs bid -Add Symbicort every 4 hours PRN per GINA -Flu shot today -ciontinue intranasal steroids   Return in about 1 year (around 09/03/2024).  I spent 30 minutes caring for this patient today, including preparing to see the patient, obtaining a medical history , reviewing a separately obtained history, performing a medically appropriate examination and/or evaluation, counseling and educating the patient/family/caregiver, ordering medications, tests, or procedures, documenting clinical information in the electronic health record, and independently interpreting results (not separately reported/billed) and communicating results to the patient/family/caregiver  Raechel Chute, MD [facility] 09/04/2023 2:52 PM    End of visit medications:  No orders of the defined types were placed in this encounter.    Current Outpatient Medications:    acetaminophen (TYLENOL) 325 MG tablet, Take 650 mg by mouth every 6 (six) hours as needed for headache or mild pain., Disp: , Rfl:    albuterol (VENTOLIN HFA) 108 (90 Base) MCG/ACT inhaler, Inhale 2 puffs into the lungs every 6 (six) hours as needed for wheezing  or shortness of breath., Disp: 8 g, Rfl: 2   amLODipine (NORVASC) 5 MG tablet, Take 1 tablet (5 mg total) by mouth daily., Disp: 30 tablet, Rfl: 11   aspirin EC 81 MG tablet, Take 1 tablet (81 mg total) by mouth daily. Swallow whole., Disp: 30 tablet, Rfl: 12   atorvastatin (LIPITOR) 80 MG tablet, Take 1 tablet by mouth once daily, Disp: 90 tablet, Rfl: 2   bismuth subsalicylate (PEPTO BISMOL) 262 MG chewable tablet, Chew 524 mg by mouth as needed. For reflux, Disp: , Rfl:    budesonide-formoterol (SYMBICORT) 160-4.5 MCG/ACT inhaler, Inhale 2 puffs into the lungs in the morning and at bedtime., Disp: 1 each, Rfl: 12   carvedilol (COREG) 25 MG tablet, Take 1 tablet (25 mg total) by mouth 2 (two) times daily with a meal., Disp: 60 tablet, Rfl: 11   diphenhydrAMINE (BENADRYL) 25 mg capsule, Take 25 mg by mouth every 6 (six) hours as needed., Disp: , Rfl:    fluticasone (FLONASE) 50 MCG/ACT nasal spray, Place 1 spray into both nostrils daily., Disp: 18.2 mL, Rfl: 11   hydrALAZINE (APRESOLINE) 50 MG tablet, Take 1 tablet (50 mg total) by mouth every 8 (eight) hours., Disp: 90 tablet, Rfl: 11   Spacer/Aero-Holding Chambers (AEROCHAMBER MV) inhaler, Use as instructed, Disp: 1 each, Rfl: 0   nitroGLYCERIN (NITROSTAT) 0.4 MG SL tablet, Place 1 tablet (0.4 mg total) under the tongue every 5 (five) minutes as needed for chest pain., Disp: 25 tablet, Rfl: 3   Subjective:   PATIENT ID: Donna Prince GENDER: female DOB: 01-31-1961, MRN: [id number]  Chief Complaint  Patient presents with   Follow-up    Patient denies  any cough, shortness of breath or wheezing.     HPI  Patient is a pleasant 62 year old female with a past medical history of asthma presenting for follow up.  Patient was last seen by me in May of 2024. She's been previously followed in clinic and PFT's had shown obstructive lung disease with significant reversibility. She was prescribed Symbicort. Following our last visit and with stressing  compliance, she's done significantly better. She feels that the symptoms are much improved with the inhaler. Her shortness of breath, cough, and wheeze are all improved. She does not have a wheeze at the moment, and no sputum production. No fevers or chills reported, and no chest pain or chest tightness. Patient does report having a stuffy nose that is more of a chronic ailment.  Patient reports a long standing history of asthma, and had been previously maintained on Advair (while living in [place]). She's originally from [place], but has lived in [place] and [place] in the past. Her asthma was best controlled in [place].   Patient is a lifelong non-smoker, and denies any occupational exposures. 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 She has two dogs, but doesn't feel her symptoms were worse after getting them.  Ancillary information including prior medications, full medical/surgical/family/social histories, and PFTs (when available) are listed below and have been reviewed.   Review of Systems  Constitutional:  Negative for chills, fever and weight loss.  Respiratory:  Negative for cough, shortness of breath and wheezing.   Cardiovascular:  Negative for chest pain.     Objective:   Vitals:   09/04/23 1405  BP: 130/80  Pulse: 83  Temp: 97.6 F (36.4 C)  TempSrc: Temporal  SpO2: 96%  Weight: 190 lb 12.8 oz (86.5 kg)  Height: 5\' 6"  (1.676 m)   96% on RA  BMI Readings from Last 3 Encounters:  09/04/23 30.80 kg/m  05/15/23 30.51 kg/m  01/15/23 29.38 kg/m   Wt Readings from Last 3 Encounters:  09/04/23 190 lb 12.8 oz (86.5 kg)  05/15/23 189 lb (85.7 kg)  01/15/23 182 lb (82.6 kg)    Physical Exam Constitutional:      Appearance: Normal appearance.  Cardiovascular:     Rate and Rhythm: Normal rate and regular rhythm.     Pulses: Normal pulses.     Heart sounds: Normal heart sounds.  Pulmonary:     Effort: Pulmonary effort is  normal.     Breath sounds: Normal breath sounds. No wheezing or rales.  Abdominal:     Palpations: Abdomen is soft.  Neurological:     General: No focal deficit present.     Mental Status: She is alert and oriented to person, place, and time. Mental status is at baseline.       Ancillary Information    Past Medical History:  Diagnosis Date   Asthma    Coronary artery disease    Hyperlipidemia    Hypertension    MI, acute, non ST segment elevation (HCC) 2020     No family history on file.   Past Surgical History:  Procedure Laterality Date   CARDIAC CATHETERIZATION  2020   x2 stents   LEFT HEART CATH AND CORONARY ANGIOGRAPHY N/A 10/23/2022   Procedure: LEFT HEART CATH AND CORONARY ANGIOGRAPHY;  Surgeon: Iran Ouch, MD;  Location: [facility] INVASIVE CV LAB;  Service: Cardiovascular;  Laterality: N/A;    Social History   Socioeconomic History  Marital status: Married    Spouse name: Not on file   Number of children: Not on file   Years of education: Not on file   Highest education level: Not on file  Occupational History   Not on file  Tobacco Use   Smoking status: Never    Passive exposure: Past   Smokeless tobacco: Never  Vaping Use   Vaping status: Never Used  Substance and Sexual Activity   Alcohol use: Never   Drug use: Never   Sexual activity: Not Currently  Other Topics Concern   Not on file  Social History Narrative   Not on file   Social Determinants of Health   Financial Resource Strain: Not on file  Food Insecurity: No Food Insecurity (10/21/2022)   Hunger Vital Sign    Worried About Running Out of Food in the Last Year: Never true    Ran Out of Food in the Last Year: Never true  Transportation Needs: No Transportation Needs (10/21/2022)   PRAPARE - Administrator, Civil Service (Medical): No    Lack of Transportation (Non-Medical): No  Physical Activity: Not on file  Stress: Not on file  Social Connections: Not on file   Intimate Partner Violence: Not At Risk (10/21/2022)   Humiliation, Afraid, Rape, and Kick questionnaire    Fear of Current or Ex-Partner: No    Emotionally Abused: No    Physically Abused: No    Sexually Abused: No     Allergies  Allergen Reactions   Lisinopril Swelling   Losartan Swelling   Mangifera Indica    Amoxicillin Hives, Diarrhea and Rash   Poison Oak Extract    Penicillins Hives and Rash     CBC    Component Value Date/Time   WBC 4.7 05/15/2023 1145   WBC 4.5 10/23/2022 0508   RBC 4.63 05/15/2023 1145   RBC 4.58 10/23/2022 0508   HGB 12.1 05/15/2023 1145   HCT 37.3 05/15/2023 1145   PLT 177 05/15/2023 1145   MCV 81 05/15/2023 1145   MCH 26.1 (L) 05/15/2023 1145   MCH 27.1 10/23/2022 0508   MCHC 32.4 05/15/2023 1145   MCHC 33.5 10/23/2022 0508   RDW 13.3 05/15/2023 1145   LYMPHSABS 0.8 05/15/2023 1145   EOSABS 0.1 05/15/2023 1145   BASOSABS 0.0 05/15/2023 1145    Pulmonary Functions Testing Results:    Latest Ref Rng & Units 12/29/2022    4:00 PM  PFT Results  FVC-Pre L 2.47   FVC-Predicted Pre % 71   FVC-Post L 2.61   FVC-Predicted Post % 75   Pre FEV1/FVC % % 51   Post FEV1/FCV % % 65   FEV1-Pre L 1.26   FEV1-Predicted Pre % 47   FEV1-Post L 1.69   DLCO uncorrected ml/min/mmHg 20.36   DLCO UNC% % 96   DLVA Predicted % 106   TLC L 5.13   TLC % Predicted % 98   RV % Predicted % 122     Outpatient Medications Prior to Visit  Medication Sig Dispense Refill   acetaminophen (TYLENOL) 325 MG tablet Take 650 mg by mouth every 6 (six) hours as needed for headache or mild pain.     albuterol (VENTOLIN HFA) 108 (90 Base) MCG/ACT inhaler Inhale 2 puffs into the lungs every 6 (six) hours as needed for wheezing or shortness of breath. 8 g 2   amLODipine (NORVASC) 5 MG tablet Take 1 tablet (5 mg total) by mouth daily. 30  tablet 11   aspirin EC 81 MG tablet Take 1 tablet (81 mg total) by mouth daily. Swallow whole. 30 tablet 12   atorvastatin (LIPITOR) 80  MG tablet Take 1 tablet by mouth once daily 90 tablet 2   bismuth subsalicylate (PEPTO BISMOL) 262 MG chewable tablet Chew 524 mg by mouth as needed. For reflux     budesonide-formoterol (SYMBICORT) 160-4.5 MCG/ACT inhaler Inhale 2 puffs into the lungs in the morning and at bedtime. 1 each 12   carvedilol (COREG) 25 MG tablet Take 1 tablet (25 mg total) by mouth 2 (two) times daily with a meal. 60 tablet 11   diphenhydrAMINE (BENADRYL) 25 mg capsule Take 25 mg by mouth every 6 (six) hours as needed.     fluticasone (FLONASE) 50 MCG/ACT nasal spray Place 1 spray into both nostrils daily. 18.2 mL 11   hydrALAZINE (APRESOLINE) 50 MG tablet Take 1 tablet (50 mg total) by mouth every 8 (eight) hours. 90 tablet 11   Spacer/Aero-Holding Chambers (AEROCHAMBER MV) inhaler Use as instructed 1 each 0   nitroGLYCERIN (NITROSTAT) 0.4 MG SL tablet Place 1 tablet (0.4 mg total) under the tongue every 5 (five) minutes as needed for chest pain. 25 tablet 3   ibuprofen (ADVIL) 200 MG tablet Take 400 mg by mouth every 6 (six) hours as needed for moderate pain. (Patient not taking: Reported on 09/04/2023)     No facility-administered medications prior to visit.

## 2023-09-05 ENCOUNTER — Other Ambulatory Visit (HOSPITAL_COMMUNITY): Payer: Self-pay

## 2023-09-05 ENCOUNTER — Telehealth: Payer: Self-pay

## 2023-09-05 DIAGNOSIS — R0602 Shortness of breath: Secondary | ICD-10-CM

## 2023-09-05 NOTE — Telephone Encounter (Signed)
Dr. Dgayli, please see below message and advise. Thanks 

## 2023-09-05 NOTE — Telephone Encounter (Signed)
*  Pulm  Pharmacy Patient Advocate Encounter   Received notification from CoverMyMeds that prior authorization for Albuterol Sulfate HFA 108 (90 Base)MCG/ACT aerosol  is required/requested.   Insurance verification completed.   The patient is insured through  [employer]  .   Per test claim:  Brand Ventolin HFA is preferred by the insurance.  If suggested medication is appropriate, Please send in a new RX and discontinue this one. If not, please advise as to why it's not appropriate so that we may request a Prior Authorization.

## 2023-09-06 MED ORDER — ALBUTEROL SULFATE HFA 108 (90 BASE) MCG/ACT IN AERS: 2.0000 | INHALATION_SPRAY | Freq: Four times a day (QID) | RESPIRATORY_TRACT | 2 refills | Status: DC | PRN

## 2023-09-06 NOTE — Telephone Encounter (Signed)
Ventolin ordered and sent to pharmacy

## 2023-09-06 NOTE — Addendum Note (Signed)
Addended byRaechel Chute on: 09/06/2023 04:18 PM   Modules accepted: Orders

## 2023-09-06 NOTE — Telephone Encounter (Signed)
Unable to reach patient, as number on file is not accurate. ATC EC and received recording that number had been changed for disconnected.

## 2023-09-07 DIAGNOSIS — J3489 Other specified disorders of nose and nasal sinuses: Secondary | ICD-10-CM | POA: Diagnosis not present

## 2023-09-07 DIAGNOSIS — J309 Allergic rhinitis, unspecified: Secondary | ICD-10-CM | POA: Diagnosis not present

## 2023-09-07 NOTE — Telephone Encounter (Signed)
ATC patient x2- number on file is not accurate and EC number is disconnected.  Letter mailed to address on file.  Nothing further needed.

## 2023-09-26 ENCOUNTER — Ambulatory Visit: Payer: Medicaid Other | Attending: Cardiovascular Disease | Admitting: Medical

## 2023-09-26 NOTE — Progress Notes (Deleted)
Cardiology Office Note:    Date:  09/26/2023   ID:  Donna Prince, DOB [birth year], MRN [id number]  PCP:  Tanna Furry, MD  [facility] Cardiologist:  None  [facility] Electrophysiologist:  None   Referring MD: Tanna Furry,*   Chief Complaint: ***  History of Present Illness:    Donna Prince is a 62 y.o. female with a hx of CAD status post DES to OM2 in 2020, OSA, hypertension, and hyperlipidemia who is being seen for hospital follow-up.   Patient was previous seen by [facility] cardiology.    January 2020 patient presented with chest pain found to have STEMI.  Cath showed 99% mid OM 2 lesion treated with DES.  Echo showed normal LV function.  She has allergy to lisinopril with angioedema.  She tolerates losartan.  She was started on DAPT with aspirin and prasugrel.   In 06/13/2020 the patient presented with acute chest pressure and dyspnea in the setting of being off her DAPT for 2 weeks.  She was admitted for non-STEMI/scad.  PCP has switched prasugrel to Plavix.  Unfortunately, she had stopped her prasugrel and not taking her Plavix.  Troponin peaked at 9.8.  She underwent left heart cath on 628 that showed very tortuous coronary arteries, ostial 70% stenosis of small first diagonal, TIMI-3 flow in the previously stented marginal,, concerns for possible scad of a small ramus branch.  Medical management was recommended. Echo showed LVEF>55%. She was discharged on aspirin, Plavix and home metoprolol.    Patient presented to [facility] ED 10/20/2022 with chest pain.  Her blood pressure was 180/107, pulse rate 107 bpm.  High-sensitivity troponin was 31 and 574.  CT negative for PE.  EKG showed sinus tach.  She was given aspirin, nitroglycerin, started on IV heparin.  She reported she had not taken her cardiac medications in months.  Echo showed LVEF 60 to 65%, no wall motion abnormalities, grade 1 diastolic dysfunction, mildly reduced RV function, moderately dilated by atrium,  mild MR, mild to moderate TR.  Cardiac cath showed patent LM stent with mild nonobstructive CAD.  Suspect elevated troponin due to supply demand mismatch in the setting of uncontrolled hypertension.  Patient was re-started on Coreg, amlodipine, and hydralazine.  Patient was last seen 01/15/2023 and was overall doing well from a cardiac perspective.  Patient had not seen pulmonology for sleep study.  Today,  Past Medical History:  Diagnosis Date   Asthma    Coronary artery disease    Hyperlipidemia    Hypertension    MI, acute, non ST segment elevation (HCC) 2020    Past Surgical History:  Procedure Laterality Date   CARDIAC CATHETERIZATION  2020   x2 stents   LEFT HEART CATH AND CORONARY ANGIOGRAPHY N/A 10/23/2022   Procedure: LEFT HEART CATH AND CORONARY ANGIOGRAPHY;  Surgeon: Iran Ouch, MD;  Location: [facility] INVASIVE CV LAB;  Service: Cardiovascular;  Laterality: N/A;    Current Medications: No outpatient medications have been marked as taking for the 09/26/23 encounter (Appointment) with Fransico Michael, Navid Lenzen H, PA-C.     Allergies:   Lisinopril, Losartan, Mangifera indica, Amoxicillin, Poison oak extract, and Penicillins   Social History   Socioeconomic History   Marital status: Married    Spouse name: Not on file   Number of children: Not on file   Years of education: Not on file   Highest education level: Not on file  Occupational History   Not on file  Tobacco Use  Smoking status: Never    Passive exposure: Past   Smokeless tobacco: Never  Vaping Use   Vaping status: Never Used  Substance and Sexual Activity   Alcohol use: Never   Drug use: Never   Sexual activity: Not Currently  Other Topics Concern   Not on file  Social History Narrative   Not on file   Social Determinants of Health   Financial Resource Strain: Not on file  Food Insecurity: No Food Insecurity (10/21/2022)   Hunger Vital Sign    Worried About Running Out of Food in the Last Year: Never  true    Ran Out of Food in the Last Year: Never true  Transportation Needs: No Transportation Needs (10/21/2022)   PRAPARE - Administrator, Civil Service (Medical): No    Lack of Transportation (Non-Medical): No  Physical Activity: Not on file  Stress: Not on file  Social Connections: Not on file     Family History: The patient's ***family history is not on file.  ROS:   Please see the history of present illness.    *** All other systems reviewed and are negative.  EKGs/Labs/Other Studies Reviewed:    The following studies were reviewed today: ***  EKG:  EKG is *** ordered today.  The ekg ordered today demonstrates ***  Recent Labs: 10/21/2022: B Natriuretic Peptide 200.0; TSH 0.034 10/22/2022: ALT 18; BUN 18; Creatinine, Ser 0.67; Potassium 4.2; Sodium 138 05/15/2023: Hemoglobin 12.1; Platelets 177  Recent Lipid Panel    Component Value Date/Time   CHOL 183 10/22/2022 0439   TRIG 87 10/22/2022 0439   HDL 55 10/22/2022 0439   CHOLHDL 3.3 10/22/2022 0439   VLDL 17 10/22/2022 0439   LDLCALC 111 (H) 10/22/2022 0439     Risk Assessment/Calculations:   {Does this patient have ATRIAL FIBRILLATION?:[id number]}   Physical Exam:    VS:  There were no vitals taken for this visit.    Wt Readings from Last 3 Encounters:  09/04/23 190 lb 12.8 oz (86.5 kg)  05/15/23 189 lb (85.7 kg)  01/15/23 182 lb (82.6 kg)     GEN: *** Well nourished, well developed in no acute distress HEENT: Normal NECK: No JVD; No carotid bruits LYMPHATICS: No lymphadenopathy CARDIAC: ***RRR, no murmurs, rubs, gallops RESPIRATORY:  Clear to auscultation without rales, wheezing or rhonchi  ABDOMEN: Soft, non-tender, non-distended MUSCULOSKELETAL:  No edema; No deformity  SKIN: Warm and dry NEUROLOGIC:  Alert and oriented x 3 PSYCHIATRIC:  Normal affect   ASSESSMENT:    No diagnosis found. PLAN:    In order of problems listed above:  ***  Disposition: Follow up {follow  up:15908} with ***   Shared Decision Making/Informed Consent   {Are you ordering a CV Procedure (e.g. stress test, cath, DCCV, TEE, etc)?   Press F2        :[id number]}    Signed, Legrand Lasser David Stall, PA-C  09/26/2023 7:41 AM    [facility]

## 2023-10-12 ENCOUNTER — Ambulatory Visit: Payer: Medicaid Other | Attending: Cardiovascular Disease | Admitting: Medical

## 2023-10-12 ENCOUNTER — Encounter: Payer: Self-pay | Admitting: Medical

## 2023-10-12 VITALS — BP 122/66 | HR 79 | Ht 66.0 in | Wt 188.8 lb

## 2023-10-12 DIAGNOSIS — E782 Mixed hyperlipidemia: Secondary | ICD-10-CM | POA: Diagnosis not present

## 2023-10-12 DIAGNOSIS — I5032 Chronic diastolic (congestive) heart failure: Secondary | ICD-10-CM | POA: Diagnosis not present

## 2023-10-12 DIAGNOSIS — I10 Essential (primary) hypertension: Secondary | ICD-10-CM | POA: Diagnosis not present

## 2023-10-12 DIAGNOSIS — G4733 Obstructive sleep apnea (adult) (pediatric): Secondary | ICD-10-CM

## 2023-10-12 DIAGNOSIS — Z9861 Coronary angioplasty status: Secondary | ICD-10-CM

## 2023-10-12 DIAGNOSIS — I2510 Atherosclerotic heart disease of native coronary artery without angina pectoris: Secondary | ICD-10-CM

## 2023-10-12 MED ORDER — ATORVASTATIN CALCIUM 80 MG PO TABS: 80.0000 mg | ORAL_TABLET | Freq: Every day | ORAL | 3 refills | Status: DC

## 2023-10-12 MED ORDER — CARVEDILOL 25 MG PO TABS: 25.0000 mg | ORAL_TABLET | Freq: Two times a day (BID) | ORAL | 3 refills | Status: DC

## 2023-10-12 MED ORDER — NITROGLYCERIN 0.4 MG SL SUBL: 0.4000 mg | SUBLINGUAL_TABLET | SUBLINGUAL | 3 refills | Status: AC | PRN

## 2023-10-12 MED ORDER — AMLODIPINE BESYLATE 5 MG PO TABS: 5.0000 mg | ORAL_TABLET | Freq: Every day | ORAL | 3 refills | Status: DC

## 2023-10-12 NOTE — Progress Notes (Unsigned)
Cardiology Office Note:    Date:  10/12/2023   ID:  Donna Prince, DOB [birth year], MRN [id number]  PCP:  Tanna Furry, MD  [facility] Cardiologist:  None  [facility] Electrophysiologist:  None   Referring MD: Tanna Furry,*   Chief Complaint: 6 month follow-up  History of Present Illness:    Donna Prince is a 62 y.o. female with a hx of CAD status post DES to OM2 in 2020, OSA, hypertension, and hyperlipidemia who is being seen for hospital follow-up.   Patient was previous seen by [facility] cardiology.    January 2020 patient presented with chest pain found to have STEMI.  Cath showed 99% mid OM 2 lesion treated with DES.  Echo showed normal LV function.  She has allergy to lisinopril with angioedema.  She tolerates losartan.  She was started on DAPT with aspirin and prasugrel.   In 06/13/2020 the patient presented with acute chest pressure and dyspnea in the setting of being off her DAPT for 2 weeks.  She was admitted for non-STEMI/scad.  PCP has switched prasugrel to Plavix.  Unfortunately, she had stopped her prasugrel and not taking her Plavix.  Troponin peaked at 9.8.  She underwent left heart cath on 628 that showed very tortuous coronary arteries, ostial 70% stenosis of small first diagonal, TIMI-3 flow in the previously stented marginal,, concerns for possible scad of a small ramus branch.  Medical management was recommended. Echo showed LVEF>55%. She was discharged on aspirin, Plavix and home metoprolol.    Patient presented to [facility] ED 10/20/2022 with chest pain.  Her blood pressure was 180/107, pulse rate 107 bpm.  High-sensitivity troponin was 31 and 574.  CT negative for PE.  EKG showed sinus tach.  She was given aspirin, nitroglycerin, started on IV heparin.  She reported she had not taken her cardiac medications in months.  Echo showed LVEF 60 to 65%, no wall motion abnormalities, grade 1 diastolic dysfunction, mildly reduced RV function, moderately  dilated by atrium, mild MR, mild to moderate TR.  Cardiac cath showed patent LM stent with mild nonobstructive CAD.  Suspect elevated troponin due to supply demand mismatch in the setting of uncontrolled hypertension.  Patient was re-started on Coreg, amlodipine, and hydralazine.   The patient was last seen 01/15/23 and was doing well.   Today, the patient is overall doing well. She is dealing with seasonal allergies, but otherwise no complaints. She did not do the sleep study, but is willing to try the sleep study at the sleep center. She denies chest pain or SOB. No lower leg edema. She eats low salt and low sugar.     Past Medical History:  Diagnosis Date   Asthma    Coronary artery disease    Hyperlipidemia    Hypertension    MI, acute, non ST segment elevation (HCC) 2020    Past Surgical History:  Procedure Laterality Date   CARDIAC CATHETERIZATION  2020   x2 stents   LEFT HEART CATH AND CORONARY ANGIOGRAPHY N/A 10/23/2022   Procedure: LEFT HEART CATH AND CORONARY ANGIOGRAPHY;  Surgeon: Iran Ouch, MD;  Location: [facility] INVASIVE CV LAB;  Service: Cardiovascular;  Laterality: N/A;    Current Medications: Current Meds  Medication Sig   acetaminophen (TYLENOL) 325 MG tablet Take 650 mg by mouth every 6 (six) hours as needed for headache or mild pain.   albuterol (VENTOLIN HFA) 108 (90 Base) MCG/ACT inhaler Inhale 2 puffs into the lungs every  6 (six) hours as needed for wheezing or shortness of breath.   amLODipine (NORVASC) 5 MG tablet Take 1 tablet (5 mg total) by mouth daily.   aspirin EC 81 MG tablet Take 1 tablet (81 mg total) by mouth daily. Swallow whole.   atorvastatin (LIPITOR) 80 MG tablet Take 1 tablet by mouth once daily   bismuth subsalicylate (PEPTO BISMOL) 262 MG chewable tablet Chew 524 mg by mouth as needed. For reflux   budesonide-formoterol (SYMBICORT) 160-4.5 MCG/ACT inhaler Inhale 2 puffs into the lungs in the morning and at bedtime.   carvedilol (COREG) 25  MG tablet Take 1 tablet (25 mg total) by mouth 2 (two) times daily with a meal.   diphenhydrAMINE (BENADRYL) 25 mg capsule Take 25 mg by mouth every 6 (six) hours as needed.   fluticasone (FLONASE) 50 MCG/ACT nasal spray Place 1 spray into both nostrils daily.   hydrALAZINE (APRESOLINE) 50 MG tablet Take 1 tablet (50 mg total) by mouth every 8 (eight) hours.   Misc Natural Product Nasal (PONARIS NA) Place 1 drop into the nose at bedtime.   Spacer/Aero-Holding Chambers (AEROCHAMBER MV) inhaler Use as instructed     Allergies:   Lisinopril, Losartan, Mangifera indica, Amoxicillin, Poison oak extract, and Penicillins   Social History   Socioeconomic History   Marital status: Married    Spouse name: Not on file   Number of children: Not on file   Years of education: Not on file   Highest education level: Not on file  Occupational History   Not on file  Tobacco Use   Smoking status: Never    Passive exposure: Past   Smokeless tobacco: Never  Vaping Use   Vaping status: Never Used  Substance and Sexual Activity   Alcohol use: Never   Drug use: Never   Sexual activity: Not Currently  Other Topics Concern   Not on file  Social History Narrative   Not on file   Social Determinants of Health   Financial Resource Strain: Not on file  Food Insecurity: No Food Insecurity (10/21/2022)   Hunger Vital Sign    Worried About Running Out of Food in the Last Year: Never true    Ran Out of Food in the Last Year: Never true  Transportation Needs: No Transportation Needs (10/21/2022)   PRAPARE - Administrator, Civil Service (Medical): No    Lack of Transportation (Non-Medical): No  Physical Activity: Not on file  Stress: Not on file  Social Connections: Not on file     Family History: The patient's family history is not on file.  ROS:   Please see the history of present illness.     All other systems reviewed and are negative.  EKGs/Labs/Other Studies Reviewed:    The  following studies were reviewed today:  Cardiac cath 09/2022       Mid Cx lesion is 20% stenosed.   Previously placed 1st Mrg stent of unknown type is  widely patent.   The left ventricular systolic function is normal.   LV end diastolic pressure is mildly elevated.   The left ventricular ejection fraction is 55-65% by visual estimate.   1.  Widely patent OM stent with no significant restenosis.  Tortuous coronary arteries with mild nonobstructive coronary artery disease. 2.  Normal LV systolic function.  Mildly elevated left ventricular end-diastolic pressure.   Recommendations: Elevated troponin is likely due to supply demand mismatch in the setting of uncontrolled hypertension. Recommend continuing medical  therapy.   Coronary Diagrams   Diagnostic Dominance: Right  Intervention     Echo 10/21/22 1. Left ventricular ejection fraction, by estimation, is 60 to 65%. The  left ventricle has normal function. The left ventricle has no regional  wall motion abnormalities. The left ventricular internal cavity size was  mildly dilated. There is severe  concentric left ventricular hypertrophy. Left ventricular diastolic  parameters are consistent with Grade I diastolic dysfunction (impaired  relaxation).   2. Right ventricular systolic function is mildly reduced. The right  ventricular size is mildly enlarged. Mildly increased right ventricular  wall thickness. There is normal pulmonary artery systolic pressure.   3. Left atrial size was moderately dilated.   4. Right atrial size was moderately dilated.   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 Mild mitral valve regurgitation.   6. Tricuspid valve regurgitation is mild to moderate.   7. The aortic valve is calcified. Aortic valve regurgitation is trivial.  Aortic valve sclerosis/calcification is present, without any evidence of  aortic stenosis.    EKG:  EKG is ordered today.  The ekg ordered today demonstrates NSR 79bpm, TWI  III  Recent Labs: 10/21/2022: B Natriuretic Peptide 200.0; TSH 0.034 10/22/2022: ALT 18; BUN 18; Creatinine, Ser 0.67; Potassium 4.2; Sodium 138 05/15/2023: Hemoglobin 12.1; Platelets 177  Recent Lipid Panel    Component Value Date/Time   CHOL 183 10/22/2022 0439   TRIG 87 10/22/2022 0439   HDL 55 10/22/2022 0439   CHOLHDL 3.3 10/22/2022 0439   VLDL 17 10/22/2022 0439   LDLCALC 111 (H) 10/22/2022 0439    Physical Exam:    VS:  BP 122/66 (BP Location: Left Arm, Patient Position: Sitting, Cuff Size: Large)   Pulse 79   Ht 5\' 6"  (1.676 m)   Wt 188 lb 12.8 oz (85.6 kg)   SpO2 96%   BMI 30.47 kg/m     Wt Readings from Last 3 Encounters:  10/12/23 188 lb 12.8 oz (85.6 kg)  09/04/23 190 lb 12.8 oz (86.5 kg)  05/15/23 189 lb (85.7 kg)     GEN:  Well nourished, well developed in no acute distress HEENT: Normal NECK: No JVD; No carotid bruits LYMPHATICS: No lymphadenopathy CARDIAC: RRR, no murmurs, rubs, gallops RESPIRATORY:  Clear to auscultation without rales, wheezing or rhonchi  ABDOMEN: Soft, non-tender, non-distended MUSCULOSKELETAL:  No edema; No deformity  SKIN: Warm and dry NEUROLOGIC:  Alert and oriented x 3 PSYCHIATRIC:  Normal affect   ASSESSMENT:    1. CAD S/P percutaneous coronary angioplasty   2. Chronic diastolic CHF (congestive heart failure) (HCC)   3. Essential hypertension   4. Hyperlipidemia, mixed   5. OSA (obstructive sleep apnea)    PLAN:    In order of problems listed above:  CAD s/p DES OM 2020 The patient denies anginal symptoms. No further ischemic work-up at this time. Continue ASA, Coeg, Lipitor and SL NTG.  Diastolic Dysfunction The patient is euvolemic on exam. She follows low salt diet. Echo in 2023 showed normal LVEF, severe LVH and G1DD. Continue Coreg.   HTN BP is normal today, continue amlodipine, hydralazine, and Coreg.   HLD LDL 11 in 2023. Continue Lipitor 80mg  daily.   OSA Patient saw pulmonology, she has not done a  sleep study, but is open to it. I recommend she revisit this.  Disposition: Follow up in 1 year(s) with MS/APP   Signed, Erial Fikes David Stall, PA-C  10/12/2023 3:49 PM    [facility]

## 2023-10-12 NOTE — Patient Instructions (Addendum)
Medication Instructions:  No changes *If you need a refill on your cardiac medications before your next appointment, please call your pharmacy*   Lab Work: Your provider would like for you to return in one week to have the following labs drawn: CBC, CMET, TSH, Lipid.   Please go to [facility] [address] ([facility] [address], [place] [address] You do not need an appointment.  They are open from 7:30 am-4 pm.  Lunch from 1:00 pm- 2:00 pm You will need to be fasting.  If you have labs (blood work) drawn today and your tests are completely normal, you will receive your results only by: MyChart Message (if you have MyChart) OR A paper copy in the mail If you have any lab test that is abnormal or we need to change your treatment, we will call you to review the results.   Testing/Procedures: None ordered   Follow-Up: At [facility], you and your health needs are our priority.  As part of our continuing mission to provide you with exceptional heart care, we have created designated Provider Care Teams.  These Care Teams include your primary Cardiologist (physician) and Advanced Practice Providers (APPs -  Physician Assistants and Nurse Practitioners) who all work together to provide you with the care you need, when you need it.  We recommend signing up for the patient portal called "MyChart".  Sign up information is provided on this After Visit Summary.  MyChart is used to connect with patients for Virtual Visits (Telemedicine).  Patients are able to view lab/test results, encounter notes, upcoming appointments, etc.  Non-urgent messages can be sent to your provider as well.   To learn more about what you can do with MyChart, go to [email or web address].    Your next appointment:   12 month(s)  Provider:   You may see one of the following Advanced Practice Providers on your designated Care Team:   Nicolasa Ducking, NP Eula Listen, PA-C Cadence Fransico Michael,  PA-C Charlsie Quest, NP

## 2023-10-19 DIAGNOSIS — G4733 Obstructive sleep apnea (adult) (pediatric): Secondary | ICD-10-CM | POA: Diagnosis not present

## 2023-10-19 DIAGNOSIS — J45909 Unspecified asthma, uncomplicated: Secondary | ICD-10-CM

## 2023-11-06 DIAGNOSIS — G4733 Obstructive sleep apnea (adult) (pediatric): Secondary | ICD-10-CM | POA: Diagnosis not present

## 2023-11-14 DIAGNOSIS — H903 Sensorineural hearing loss, bilateral: Secondary | ICD-10-CM | POA: Diagnosis not present

## 2023-11-14 DIAGNOSIS — J309 Allergic rhinitis, unspecified: Secondary | ICD-10-CM | POA: Diagnosis not present

## 2023-12-04 DIAGNOSIS — I5032 Chronic diastolic (congestive) heart failure: Secondary | ICD-10-CM | POA: Diagnosis not present

## 2023-12-04 DIAGNOSIS — I10 Essential (primary) hypertension: Secondary | ICD-10-CM | POA: Diagnosis not present

## 2023-12-05 LAB — COMPREHENSIVE METABOLIC PANEL
ALT: 31 [IU]/L (ref 0–32)
AST: 23 [IU]/L (ref 0–40)
Albumin: 4.1 g/dL (ref 3.8–4.9)
Alkaline Phosphatase: 98 [IU]/L (ref 44–121)
BUN/Creatinine Ratio: 21 (ref 12–28)
BUN: 15 mg/dL (ref 8–27)
Bilirubin Total: 0.5 mg/dL (ref 0.0–1.2)
CO2: 22 mmol/L (ref 20–29)
Calcium: 9.2 mg/dL (ref 8.7–10.3)
Chloride: 108 mmol/L — ABNORMAL HIGH (ref 96–106)
Creatinine, Ser: 0.73 mg/dL (ref 0.57–1.00)
Globulin, Total: 1.9 g/dL (ref 1.5–4.5)
Glucose: 96 mg/dL (ref 70–99)
Potassium: 4.3 mmol/L (ref 3.5–5.2)
Sodium: 144 mmol/L (ref 134–144)
Total Protein: 6 g/dL (ref 6.0–8.5)
eGFR: 94 mL/min/{1.73_m2} (ref >59)

## 2023-12-05 LAB — LIPID PANEL
Chol/HDL Ratio: 2.4 {ratio} (ref 0.0–4.4)
Cholesterol, Total: 127 mg/dL (ref 100–199)
HDL: 53 mg/dL (ref >39)
LDL Chol Calc (NIH): 59 mg/dL (ref 0–99)
Triglycerides: 72 mg/dL (ref 0–149)
VLDL Cholesterol Cal: 15 mg/dL (ref 5–40)

## 2023-12-05 LAB — CBC
Hematocrit: 37.7 % (ref 34.0–46.6)
Hemoglobin: 12.5 g/dL (ref 11.1–15.9)
MCH: 26.8 pg (ref 26.6–33.0)
MCHC: 33.2 g/dL (ref 31.5–35.7)
MCV: 81 fL (ref 79–97)
Platelets: 149 10*3/uL — ABNORMAL LOW (ref 150–450)
RBC: 4.66 x10E6/uL (ref 3.77–5.28)
RDW: 12.5 % (ref 11.7–15.4)
WBC: 2.8 10*3/uL — ABNORMAL LOW (ref 3.4–10.8)

## 2023-12-05 LAB — TSH: TSH: 0.008 u[IU]/mL — ABNORMAL LOW (ref 0.450–4.500)

## 2023-12-10 ENCOUNTER — Ambulatory Visit: Payer: Medicaid Other | Admitting: Student in an Organized Health Care Education/Training Program

## 2023-12-10 ENCOUNTER — Other Ambulatory Visit: Payer: Self-pay

## 2023-12-10 ENCOUNTER — Telehealth: Payer: Self-pay | Admitting: Cardiovascular Disease

## 2023-12-10 ENCOUNTER — Encounter: Payer: Self-pay | Admitting: Student in an Organized Health Care Education/Training Program

## 2023-12-10 VITALS — BP 100/64 | HR 75 | Temp 97.6°F | Ht 66.0 in | Wt 186.6 lb

## 2023-12-10 DIAGNOSIS — G4733 Obstructive sleep apnea (adult) (pediatric): Secondary | ICD-10-CM

## 2023-12-10 DIAGNOSIS — R0602 Shortness of breath: Secondary | ICD-10-CM | POA: Diagnosis not present

## 2023-12-10 DIAGNOSIS — J45909 Unspecified asthma, uncomplicated: Secondary | ICD-10-CM | POA: Diagnosis not present

## 2023-12-10 DIAGNOSIS — Z9109 Other allergy status, other than to drugs and biological substances: Secondary | ICD-10-CM

## 2023-12-10 LAB — NITRIC OXIDE: Nitric Oxide: 23

## 2023-12-10 MED ORDER — HYDRALAZINE HCL 50 MG PO TABS: 50.0000 mg | ORAL_TABLET | Freq: Three times a day (TID) | ORAL | 9 refills | Status: DC

## 2023-12-10 NOTE — Telephone Encounter (Signed)
*  STAT* If patient is at the pharmacy, call can be transferred to refill team.   1. Which medications need to be refilled? (please list name of each medication and dose if known) Hydralazine HCL Tab 50 MG   2. Would you like to learn more about the convenience, safety, & potential cost savings by using the [facility]? no     3. Are you open to using the [facility] (Type [facility]. no ).   4. Which pharmacy/location (including street and city if local pharmacy) is medication to be sent to?[facility] [address] [place] [address]   5. Do they need a 30 day or 90 day supply? 90

## 2023-12-10 NOTE — Progress Notes (Signed)
Assessment & Plan:   #Mild Intermittent Asthma #Environmental allergies  Patient with a history of asthma, with recent PFT's showing notable obstruction (FEV1 1.26L - 47%predicted, post 1.69L - 63% predicted) overall consistent with asthma. She was started on Symbicort, stepped up to 160-4.5 two puffs bid and her symptoms have been well controlled. FENO in clinic today is normal at 23 ppb. She has no wheeze on exam and denies any exacerbation. Allergen panel was positive to dust mites, dogs, and pollen. I recommended keeping her dogs out of the bedroom and also working on dust mite mitigation. She will continue intra-nasal steroids which she's found quite helpful.  -continue Symbicort two puffs bid and PRN  #OSA  Home sleep study showing sleep apnea with AHI of 15.8 on the first night, and 17.1 on the second night. Given she is symptomatic (with fatigue, snoring, headaches, ) we will initiate CPAP. Encouraged weight loss.  -initiate auto-CPAP (6-20)  Return in about 3 months (around 03/09/2024).  I spent 30 minutes caring for this patient today, including preparing to see the patient, obtaining a medical history , reviewing a separately obtained history, performing a medically appropriate examination and/or evaluation, counseling and educating the patient/family/caregiver, ordering medications, tests, or procedures, and documenting clinical information in the electronic health record  Raechel Chute, MD [facility] 12/10/2023 10:56 AM    End of visit medications:  No orders of the defined types were placed in this encounter.    Current Outpatient Medications:    acetaminophen (TYLENOL) 325 MG tablet, Take 650 mg by mouth every 6 (six) hours as needed for headache or mild pain., Disp: , Rfl:    albuterol (VENTOLIN HFA) 108 (90 Base) MCG/ACT inhaler, Inhale 2 puffs into the lungs every 6 (six) hours as needed for wheezing or shortness of breath., Disp: 36 g, Rfl:  2   amLODipine (NORVASC) 5 MG tablet, Take 1 tablet (5 mg total) by mouth daily., Disp: 90 tablet, Rfl: 3   aspirin EC 81 MG tablet, Take 1 tablet (81 mg total) by mouth daily. Swallow whole., Disp: 30 tablet, Rfl: 12   atorvastatin (LIPITOR) 80 MG tablet, Take 1 tablet (80 mg total) by mouth daily., Disp: 90 tablet, Rfl: 3   bismuth subsalicylate (PEPTO BISMOL) 262 MG chewable tablet, Chew 524 mg by mouth as needed. For reflux, Disp: , Rfl:    budesonide-formoterol (SYMBICORT) 160-4.5 MCG/ACT inhaler, Inhale 2 puffs into the lungs in the morning and at bedtime., Disp: 1 each, Rfl: 12   carvedilol (COREG) 25 MG tablet, Take 1 tablet (25 mg total) by mouth 2 (two) times daily with a meal., Disp: 180 tablet, Rfl: 3   diphenhydrAMINE (BENADRYL) 25 mg capsule, Take 25 mg by mouth every 6 (six) hours as needed., Disp: , Rfl:    fluticasone (FLONASE) 50 MCG/ACT nasal spray, Place 1 spray into both nostrils daily., Disp: 18.2 mL, Rfl: 11   hydrALAZINE (APRESOLINE) 50 MG tablet, Take 1 tablet (50 mg total) by mouth every 8 (eight) hours., Disp: 90 tablet, Rfl: 11   Misc Natural Product Nasal (PONARIS NA), Place 1 drop into the nose at bedtime., Disp: , Rfl:    nitroGLYCERIN (NITROSTAT) 0.4 MG SL tablet, Place 1 tablet (0.4 mg total) under the tongue every 5 (five) minutes as needed for chest pain., Disp: 25 tablet, Rfl: 3   Spacer/Aero-Holding Chambers (AEROCHAMBER MV) inhaler, Use as instructed, Disp: 1 each, Rfl: 0   Subjective:   PATIENT ID: Donna Prince  Elita Quick GENDER: female DOB: [birth year], MRN: [id number]  Chief Complaint  Patient presents with   Follow-up    No cough, or wheezing. Shortness of breath on exertion.     HPI  Patient is a pleasant 61 year old female with a past medical history of asthma presenting for follow up.  Patient was last seen by me in September of 2024. She's been previously followed in clinic and PFT's had shown obstructive lung disease with significant reversibility. She  was prescribed Symbicort. Following a previous visit where I stressed compliance, she felt improved. Today, she is reporting feeling at her baseline from an asthma perspective. She is compliant with her symbicort and reports no symptoms. No shortness of breath, cough, or wheeze reported. Dogs continue to share her bedroom.  She did have a home sleep study that was positive for OSA. She is presenting to discuss results. She reports fatigue, morning headaches, and snoring. She is willing to use a CPAP machine if prescribed.  Patient reports a long standing history of asthma, and had been previously maintained on Advair (while living in [place]). She's originally from [place], but has lived in [place] and [place] in the past. Her asthma was best controlled in [place].   Patient is a lifelong non-smoker, and denies any occupational exposures. 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 She has two dogs, but doesn't feel her symptoms were worse after getting them.  Ancillary information including prior medications, full medical/surgical/family/social histories, and PFTs (when available) are listed below and have been reviewed.   Review of Systems  Constitutional:  Positive for malaise/fatigue. Negative for chills, fever and weight loss.  Respiratory:  Negative for cough, shortness of breath and wheezing.   Cardiovascular:  Negative for chest pain.     Objective:   Vitals:   12/10/23 1039  BP: 100/64  Pulse: 75  Temp: 97.6 F (36.4 C)  TempSrc: Temporal  SpO2: 98%  Weight: 186 lb 9.6 oz (84.6 kg)  Height: 5\' 6"  (1.676 m)   98% on RA BMI Readings from Last 3 Encounters:  12/10/23 30.12 kg/m  10/12/23 30.47 kg/m  09/04/23 30.80 kg/m   Wt Readings from Last 3 Encounters:  12/10/23 186 lb 9.6 oz (84.6 kg)  10/12/23 188 lb 12.8 oz (85.6 kg)  09/04/23 190 lb 12.8 oz (86.5 kg)    Physical Exam Constitutional:      Appearance: Normal appearance.   Cardiovascular:     Rate and Rhythm: Normal rate and regular rhythm.     Pulses: Normal pulses.     Heart sounds: Normal heart sounds.  Pulmonary:     Effort: Pulmonary effort is normal.     Breath sounds: Normal breath sounds. No wheezing or rales.  Abdominal:     Palpations: Abdomen is soft.  Neurological:     General: No focal deficit present.     Mental Status: She is alert and oriented to person, place, and time. Mental status is at baseline.       Ancillary Information    Past Medical History:  Diagnosis Date   Asthma    Coronary artery disease    Hyperlipidemia    Hypertension    MI, acute, non ST segment elevation (HCC) 2020     No family history on file.   Past Surgical History:  Procedure Laterality Date   CARDIAC CATHETERIZATION  2020   x2 stents   LEFT HEART CATH AND CORONARY ANGIOGRAPHY  N/A 10/23/2022   Procedure: LEFT HEART CATH AND CORONARY ANGIOGRAPHY;  Surgeon: Iran Ouch, MD;  Location: [facility] INVASIVE CV LAB;  Service: Cardiovascular;  Laterality: N/A;    Social History   Socioeconomic History   Marital status: Married    Spouse name: Not on file   Number of children: Not on file   Years of education: Not on file   Highest education level: Not on file  Occupational History   Not on file  Tobacco Use   Smoking status: Never    Passive exposure: Past   Smokeless tobacco: Never  Vaping Use   Vaping status: Never Used  Substance and Sexual Activity   Alcohol use: Never   Drug use: Never   Sexual activity: Not Currently  Other Topics Concern   Not on file  Social History Narrative   Not on file   Social Drivers of Health   Financial Resource Strain: Not on file  Food Insecurity: No Food Insecurity (10/21/2022)   Hunger Vital Sign    Worried About Running Out of Food in the Last Year: Never true    Ran Out of Food in the Last Year: Never true  Transportation Needs: No Transportation Needs (10/21/2022)   PRAPARE -  Administrator, Civil Service (Medical): No    Lack of Transportation (Non-Medical): No  Physical Activity: Not on file  Stress: Not on file  Social Connections: Not on file  Intimate Partner Violence: Not At Risk (10/21/2022)   Humiliation, Afraid, Rape, and Kick questionnaire    Fear of Current or Ex-Partner: No    Emotionally Abused: No    Physically Abused: No    Sexually Abused: No     Allergies  Allergen Reactions   Lisinopril Swelling   Losartan Swelling   Mangifera Indica    Amoxicillin Hives, Diarrhea and Rash   Poison Oak Extract    Penicillins Hives and Rash     CBC    Component Value Date/Time   WBC 2.8 (L) 12/04/2023 1015   WBC 4.5 10/23/2022 0508   RBC 4.66 12/04/2023 1015   RBC 4.58 10/23/2022 0508   HGB 12.5 12/04/2023 1015   HCT 37.7 12/04/2023 1015   PLT 149 (L) 12/04/2023 1015   MCV 81 12/04/2023 1015   MCH 26.8 12/04/2023 1015   MCH 27.1 10/23/2022 0508   MCHC 33.2 12/04/2023 1015   MCHC 33.5 10/23/2022 0508   RDW 12.5 12/04/2023 1015   LYMPHSABS 0.8 05/15/2023 1145   EOSABS 0.1 05/15/2023 1145   BASOSABS 0.0 05/15/2023 1145    Pulmonary Functions Testing Results:    Latest Ref Rng & Units 12/29/2022    4:00 PM  PFT Results  FVC-Pre L 2.47   FVC-Predicted Pre % 71   FVC-Post L 2.61   FVC-Predicted Post % 75   Pre FEV1/FVC % % 51   Post FEV1/FCV % % 65   FEV1-Pre L 1.26   FEV1-Predicted Pre % 47   FEV1-Post L 1.69   DLCO uncorrected ml/min/mmHg 20.36   DLCO UNC% % 96   DLVA Predicted % 106   TLC L 5.13   TLC % Predicted % 98   RV % Predicted % 122     Outpatient Medications Prior to Visit  Medication Sig Dispense Refill   acetaminophen (TYLENOL) 325 MG tablet Take 650 mg by mouth every 6 (six) hours as needed for headache or mild pain.     albuterol (VENTOLIN HFA) 108 (90  Base) MCG/ACT inhaler Inhale 2 puffs into the lungs every 6 (six) hours as needed for wheezing or shortness of breath. 36 g 2   amLODipine (NORVASC)  5 MG tablet Take 1 tablet (5 mg total) by mouth daily. 90 tablet 3   aspirin EC 81 MG tablet Take 1 tablet (81 mg total) by mouth daily. Swallow whole. 30 tablet 12   atorvastatin (LIPITOR) 80 MG tablet Take 1 tablet (80 mg total) by mouth daily. 90 tablet 3   bismuth subsalicylate (PEPTO BISMOL) 262 MG chewable tablet Chew 524 mg by mouth as needed. For reflux     budesonide-formoterol (SYMBICORT) 160-4.5 MCG/ACT inhaler Inhale 2 puffs into the lungs in the morning and at bedtime. 1 each 12   carvedilol (COREG) 25 MG tablet Take 1 tablet (25 mg total) by mouth 2 (two) times daily with a meal. 180 tablet 3   diphenhydrAMINE (BENADRYL) 25 mg capsule Take 25 mg by mouth every 6 (six) hours as needed.     fluticasone (FLONASE) 50 MCG/ACT nasal spray Place 1 spray into both nostrils daily. 18.2 mL 11   hydrALAZINE (APRESOLINE) 50 MG tablet Take 1 tablet (50 mg total) by mouth every 8 (eight) hours. 90 tablet 11   Misc Natural Product Nasal (PONARIS NA) Place 1 drop into the nose at bedtime.     nitroGLYCERIN (NITROSTAT) 0.4 MG SL tablet Place 1 tablet (0.4 mg total) under the tongue every 5 (five) minutes as needed for chest pain. 25 tablet 3   Spacer/Aero-Holding Chambers (AEROCHAMBER MV) inhaler Use as instructed 1 each 0   No facility-administered medications prior to visit.

## 2024-01-17 DIAGNOSIS — G4733 Obstructive sleep apnea (adult) (pediatric): Secondary | ICD-10-CM | POA: Diagnosis not present

## 2024-02-17 DIAGNOSIS — G4733 Obstructive sleep apnea (adult) (pediatric): Secondary | ICD-10-CM | POA: Diagnosis not present

## 2024-03-10 ENCOUNTER — Other Ambulatory Visit: Payer: Self-pay | Admitting: Student in an Organized Health Care Education/Training Program

## 2024-03-10 DIAGNOSIS — R0602 Shortness of breath: Secondary | ICD-10-CM

## 2024-03-12 ENCOUNTER — Encounter: Payer: Self-pay | Admitting: Student in an Organized Health Care Education/Training Program

## 2024-03-12 ENCOUNTER — Ambulatory Visit
Admission: RE | Admit: 2024-03-12 | Discharge: 2024-03-12 | Disposition: A | Discharge status: Home / Self Care | Source: Ambulatory Visit | Attending: Student in an Organized Health Care Education/Training Program | Admitting: Student in an Organized Health Care Education/Training Program

## 2024-03-12 ENCOUNTER — Ambulatory Visit: Payer: Medicaid Other | Admitting: Student in an Organized Health Care Education/Training Program

## 2024-03-12 VITALS — BP 108/66 | HR 78 | Temp 97.6°F | Ht 66.0 in | Wt 168.0 lb

## 2024-03-12 DIAGNOSIS — G4733 Obstructive sleep apnea (adult) (pediatric): Secondary | ICD-10-CM

## 2024-03-12 DIAGNOSIS — J45909 Unspecified asthma, uncomplicated: Secondary | ICD-10-CM | POA: Insufficient documentation

## 2024-03-12 DIAGNOSIS — R0602 Shortness of breath: Secondary | ICD-10-CM

## 2024-03-12 DIAGNOSIS — J4540 Moderate persistent asthma, uncomplicated: Secondary | ICD-10-CM | POA: Diagnosis not present

## 2024-03-12 LAB — NITRIC OXIDE: Nitric Oxide: 16

## 2024-03-12 NOTE — Progress Notes (Signed)
 Assessment & Plan:   # Moderate persistent asthma # Environmental allergies  Patient has a history of asthma with recent PFTs showing notable obstruction (FEV1 1.26L - 47%predicted, post 1.69L - 63% predicted) overall consistent with asthma.  She was started on ICS/LABA with Symbicort, currently on 160-4.52 puffs twice daily with the use of a spacer device and improvement in symptoms. We have went over how to use an HFA device with a spacer in clinic today. FeNO in clinic today is normal at 16 ppb.  Physical exam is unremarkable and lung fields are clear without wheeze or Rales.  Given persistent symptoms with clear lungs and normal FeNO, I will repeat her pulmonary function test (spirometry pre and postbronchodilator) to assess for further reversibility.  Should that be the case, I would further step up her ICS dose and consider the use of Biologics.  I also obtain a chest x-ray today to rule out any infectious etiologies.  Finally, I did recommend that the patient establish care with a PCP.  - Nitric oxide normal at 16 ppb - Pulmonary Function Test [facility] Only; Future - DG Chest 2 View; > reviewed and within normal, no infiltrates - inhaler training provided  #OSA  Home sleep study showing sleep apnea with AHI of 15.8 on the first night, and 17.1 on the second night. Given she was symptomatic, we initiated CPAP therapy and CPAP reviewed compliance report.  Unfortunately, patient has not been using CPAP consistently and reports discomfort with the mask.  I offered her a referral to dedicated sleep specialist but she would not like to do that at this point.  Encouraged her to be compliant with CPAP.  -Continue auto CPAP (6-20 cm H2O)   Return in about 3 months (around 06/12/2024).  I spent 33 minutes caring for this patient today, including preparing to see the patient, obtaining a medical history , reviewing a separately obtained history, performing a medically appropriate examination and/or  evaluation, counseling and educating the patient/family/caregiver, ordering medications, tests, or procedures, documenting clinical information in the electronic health record, and independently interpreting results (not separately reported/billed) and communicating results to the patient/family/caregiver. I also provided the patient with inhaler training during today's visit.  Raechel Chute, MD [facility]   End of visit medications:  No orders of the defined types were placed in this encounter.    Current Outpatient Medications:    acetaminophen (TYLENOL) 325 MG tablet, Take 650 mg by mouth every 6 (six) hours as needed for headache or mild pain., Disp: , Rfl:    amLODipine (NORVASC) 5 MG tablet, Take 1 tablet (5 mg total) by mouth daily., Disp: 90 tablet, Rfl: 3   aspirin EC 81 MG tablet, Take 1 tablet (81 mg total) by mouth daily. Swallow whole., Disp: 30 tablet, Rfl: 12   atorvastatin (LIPITOR) 80 MG tablet, Take 1 tablet (80 mg total) by mouth daily., Disp: 90 tablet, Rfl: 3   bismuth subsalicylate (PEPTO BISMOL) 262 MG chewable tablet, Chew 524 mg by mouth as needed. For reflux, Disp: , Rfl:    budesonide-formoterol (SYMBICORT) 160-4.5 MCG/ACT inhaler, Inhale 2 puffs into the lungs in the morning and at bedtime., Disp: 1 each, Rfl: 12   carvedilol (COREG) 25 MG tablet, Take 1 tablet (25 mg total) by mouth 2 (two) times daily with a meal., Disp: 180 tablet, Rfl: 3   cetirizine (ZYRTEC) 10 MG tablet, Take 10 mg by mouth daily., Disp: , Rfl:    diphenhydrAMINE (BENADRYL) 25  mg capsule, Take 25 mg by mouth every 6 (six) hours as needed., Disp: , Rfl:    fluticasone (FLONASE) 50 MCG/ACT nasal spray, Place 1 spray into both nostrils daily., Disp: 18.2 mL, Rfl: 11   hydrALAZINE (APRESOLINE) 50 MG tablet, Take 1 tablet (50 mg total) by mouth every 8 (eight) hours., Disp: 90 tablet, Rfl: 9   Misc Natural Product Nasal (PONARIS NA), Place 1 drop into the nose at bedtime.,  Disp: , Rfl:    Spacer/Aero-Holding Chambers (AEROCHAMBER MV) inhaler, Use as instructed, Disp: 1 each, Rfl: 0   VENTOLIN HFA 108 (90 Base) MCG/ACT inhaler, INHALE 2 PUFFS BY MOUTH EVERY 6 HOURS AS NEEDED FOR WHEEZING AND FOR SHORTNESS OF BREATH, Disp: 54 g, Rfl: 0   nitroGLYCERIN (NITROSTAT) 0.4 MG SL tablet, Place 1 tablet (0.4 mg total) under the tongue every 5 (five) minutes as needed for chest pain., Disp: 25 tablet, Rfl: 3   Subjective:   PATIENT ID: Donna Prince GENDER: female DOB: 02-Jun-1961, MRN: [id number]  Chief Complaint  Patient presents with   Follow-up    Shortness of breath. Reports nasal congestion.     HPI  Patient is a pleasant 63 year old female presenting to clinic for follow-up of asthma.  Patient has felt some increase in symptoms over the past few days requiring her to use her inhalers more frequently.  She is use her albuterol inhaler a few times which has led to significant improvement.  She is compliant with her Symbicort.  She is using her inhalers with a spacer device. She has not had fevers or chills, and no signs of an URTI are reported. No chest tightness or wheezing is reported either, and symptoms mostly are those of dyspnea. She has increased rhinorrhea.  Patient was last seen in December 2024 where symptoms were noted to be well-controlled.  Patient has an allergen panel positive to dust mites, dogs, and pollen.  She does have a dog and would like he continues to be exposed to dog dander as well as dust mites.   She did have a home sleep study that was positive for OSA.  We started CPAP in December and she is presenting to discuss compliance and review symptoms.  She feels that she is unable to tolerate the CPAP and has to take it up multiple times during the night secondary to discomfort and shortness of breath.   Patient reports a long standing history of asthma, and had been previously maintained on Advair (while living in [place]). She's  originally from [place], but has lived in [place] and [place] in the past. Her asthma was best controlled in [place].   Patient is a lifelong non-smoker, and denies any occupational exposures. 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 She has two dogs, but doesn't feel her symptoms were worse after getting them.  Ancillary information including prior medications, full medical/surgical/family/social histories, and PFTs (when available) are listed below and have been reviewed.   Review of Systems  Constitutional:  Negative for chills, fever, malaise/fatigue and weight loss.  HENT:  Positive for congestion.   Respiratory:  Positive for shortness of breath. Negative for cough and wheezing.   Cardiovascular:  Negative for chest pain.     Objective:   Vitals:   03/12/24 1354  BP: 108/66  Pulse: 78  Temp: 97.6 F (36.4 C)  TempSrc: Temporal  SpO2: 98%  Weight: 168 lb (76.2 kg)  Height: 5\' 6"  (  1.676 m)   98% on RA  BMI Readings from Last 3 Encounters:  03/12/24 27.12 kg/m  12/10/23 30.12 kg/m  10/12/23 30.47 kg/m   Wt Readings from Last 3 Encounters:  03/12/24 168 lb (76.2 kg)  12/10/23 186 lb 9.6 oz (84.6 kg)  10/12/23 188 lb 12.8 oz (85.6 kg)    Physical Exam Constitutional:      Appearance: Normal appearance.  Cardiovascular:     Rate and Rhythm: Normal rate and regular rhythm.     Pulses: Normal pulses.     Heart sounds: Normal heart sounds.  Pulmonary:     Effort: Pulmonary effort is normal.     Breath sounds: Normal breath sounds. No wheezing or rales.  Abdominal:     Palpations: Abdomen is soft.  Neurological:     General: No focal deficit present.     Mental Status: She is alert and oriented to person, place, and time. Mental status is at baseline.       Ancillary Information    Past Medical History:  Diagnosis Date   Asthma    Coronary artery disease    Hyperlipidemia    Hypertension    MI, acute, non ST segment  elevation (HCC) 2020     No family history on file.   Past Surgical History:  Procedure Laterality Date   CARDIAC CATHETERIZATION  2020   x2 stents   LEFT HEART CATH AND CORONARY ANGIOGRAPHY N/A 10/23/2022   Procedure: LEFT HEART CATH AND CORONARY ANGIOGRAPHY;  Surgeon: Iran Ouch, MD;  Location: [facility] INVASIVE CV LAB;  Service: Cardiovascular;  Laterality: N/A;    Social History   Socioeconomic History   Marital status: Married    Spouse name: Not on file   Number of children: Not on file   Years of education: Not on file   Highest education level: Not on file  Occupational History   Not on file  Tobacco Use   Smoking status: Never    Passive exposure: Past   Smokeless tobacco: Never  Vaping Use   Vaping status: Never Used  Substance and Sexual Activity   Alcohol use: Never   Drug use: Never   Sexual activity: Not Currently  Other Topics Concern   Not on file  Social History Narrative   Not on file   Social Drivers of Health   Financial Resource Strain: Not on file  Food Insecurity: No Food Insecurity (10/21/2022)   Hunger Vital Sign    Worried About Running Out of Food in the Last Year: Never true    Ran Out of Food in the Last Year: Never true  Transportation Needs: No Transportation Needs (10/21/2022)   PRAPARE - Administrator, Civil Service (Medical): No    Lack of Transportation (Non-Medical): No  Physical Activity: Not on file  Stress: Not on file  Social Connections: Not on file  Intimate Partner Violence: Not At Risk (10/21/2022)   Humiliation, Afraid, Rape, and Kick questionnaire    Fear of Current or Ex-Partner: No    Emotionally Abused: No    Physically Abused: No    Sexually Abused: No     Allergies  Allergen Reactions   Lisinopril Swelling   Losartan Swelling   Mangifera Indica    Amoxicillin Hives, Diarrhea and Rash   Poison Oak Extract    Penicillins Hives and Rash     CBC    Component Value Date/Time   WBC 2.8  (L) 12/04/2023 1015  WBC 4.5 10/23/2022 0508   RBC 4.66 12/04/2023 1015   RBC 4.58 10/23/2022 0508   HGB 12.5 12/04/2023 1015   HCT 37.7 12/04/2023 1015   PLT 149 (L) 12/04/2023 1015   MCV 81 12/04/2023 1015   MCH 26.8 12/04/2023 1015   MCH 27.1 10/23/2022 0508   MCHC 33.2 12/04/2023 1015   MCHC 33.5 10/23/2022 0508   RDW 12.5 12/04/2023 1015   LYMPHSABS 0.8 05/15/2023 1145   EOSABS 0.1 05/15/2023 1145   BASOSABS 0.0 05/15/2023 1145    Pulmonary Functions Testing Results:    Latest Ref Rng & Units 12/29/2022    4:00 PM  PFT Results  FVC-Pre L 2.47   FVC-Predicted Pre % 71   FVC-Post L 2.61   FVC-Predicted Post % 75   Pre FEV1/FVC % % 51   Post FEV1/FCV % % 65   FEV1-Pre L 1.26   FEV1-Predicted Pre % 47   FEV1-Post L 1.69   DLCO uncorrected ml/min/mmHg 20.36   DLCO UNC% % 96   DLVA Predicted % 106   TLC L 5.13   TLC % Predicted % 98   RV % Predicted % 122     Outpatient Medications Prior to Visit  Medication Sig Dispense Refill   acetaminophen (TYLENOL) 325 MG tablet Take 650 mg by mouth every 6 (six) hours as needed for headache or mild pain.     amLODipine (NORVASC) 5 MG tablet Take 1 tablet (5 mg total) by mouth daily. 90 tablet 3   aspirin EC 81 MG tablet Take 1 tablet (81 mg total) by mouth daily. Swallow whole. 30 tablet 12   atorvastatin (LIPITOR) 80 MG tablet Take 1 tablet (80 mg total) by mouth daily. 90 tablet 3   bismuth subsalicylate (PEPTO BISMOL) 262 MG chewable tablet Chew 524 mg by mouth as needed. For reflux     budesonide-formoterol (SYMBICORT) 160-4.5 MCG/ACT inhaler Inhale 2 puffs into the lungs in the morning and at bedtime. 1 each 12   carvedilol (COREG) 25 MG tablet Take 1 tablet (25 mg total) by mouth 2 (two) times daily with a meal. 180 tablet 3   cetirizine (ZYRTEC) 10 MG tablet Take 10 mg by mouth daily.     diphenhydrAMINE (BENADRYL) 25 mg capsule Take 25 mg by mouth every 6 (six) hours as needed.     fluticasone (FLONASE) 50 MCG/ACT nasal  spray Place 1 spray into both nostrils daily. 18.2 mL 11   hydrALAZINE (APRESOLINE) 50 MG tablet Take 1 tablet (50 mg total) by mouth every 8 (eight) hours. 90 tablet 9   Misc Natural Product Nasal (PONARIS NA) Place 1 drop into the nose at bedtime.     Spacer/Aero-Holding Chambers (AEROCHAMBER MV) inhaler Use as instructed 1 each 0   VENTOLIN HFA 108 (90 Base) MCG/ACT inhaler INHALE 2 PUFFS BY MOUTH EVERY 6 HOURS AS NEEDED FOR WHEEZING AND FOR SHORTNESS OF BREATH 54 g 0   nitroGLYCERIN (NITROSTAT) 0.4 MG SL tablet Place 1 tablet (0.4 mg total) under the tongue every 5 (five) minutes as needed for chest pain. 25 tablet 3   No facility-administered medications prior to visit.

## 2024-06-02 ENCOUNTER — Encounter

## 2024-06-09 ENCOUNTER — Ambulatory Visit: Admitting: Student in an Organized Health Care Education/Training Program

## 2024-06-12 ENCOUNTER — Other Ambulatory Visit: Payer: Self-pay | Admitting: Student in an Organized Health Care Education/Training Program

## 2024-06-12 DIAGNOSIS — J45909 Unspecified asthma, uncomplicated: Secondary | ICD-10-CM

## 2024-10-02 ENCOUNTER — Other Ambulatory Visit: Payer: Self-pay | Admitting: Family Medicine

## 2024-10-02 ENCOUNTER — Other Ambulatory Visit: Payer: Self-pay | Admitting: Medical

## 2024-10-02 DIAGNOSIS — J45909 Unspecified asthma, uncomplicated: Secondary | ICD-10-CM

## 2024-10-02 DIAGNOSIS — R0602 Shortness of breath: Secondary | ICD-10-CM

## 2024-10-08 ENCOUNTER — Ambulatory Visit: Attending: Medical | Admitting: Medical

## 2024-10-08 ENCOUNTER — Encounter: Payer: Self-pay | Admitting: Medical

## 2024-10-08 VITALS — BP 100/50 | HR 93 | Wt 139.4 lb

## 2024-10-08 DIAGNOSIS — I10 Essential (primary) hypertension: Secondary | ICD-10-CM | POA: Diagnosis not present

## 2024-10-08 DIAGNOSIS — R7989 Other specified abnormal findings of blood chemistry: Secondary | ICD-10-CM | POA: Insufficient documentation

## 2024-10-08 DIAGNOSIS — G4733 Obstructive sleep apnea (adult) (pediatric): Secondary | ICD-10-CM | POA: Insufficient documentation

## 2024-10-08 DIAGNOSIS — Z9861 Coronary angioplasty status: Secondary | ICD-10-CM | POA: Insufficient documentation

## 2024-10-08 DIAGNOSIS — Z79899 Other long term (current) drug therapy: Secondary | ICD-10-CM | POA: Diagnosis not present

## 2024-10-08 DIAGNOSIS — I2510 Atherosclerotic heart disease of native coronary artery without angina pectoris: Secondary | ICD-10-CM | POA: Diagnosis not present

## 2024-10-08 DIAGNOSIS — I5032 Chronic diastolic (congestive) heart failure: Secondary | ICD-10-CM | POA: Diagnosis not present

## 2024-10-08 DIAGNOSIS — I517 Cardiomegaly: Secondary | ICD-10-CM | POA: Diagnosis not present

## 2024-10-08 NOTE — Patient Instructions (Signed)
 Medication Instructions:  Your physician recommends the following medication changes.  STOP TAKING: Amlodipine    *If you need a refill on your cardiac medications before your next appointment, please call your pharmacy*  Lab Work: Your provider would like for you to have following labs drawn today Lipid, CMP, CBC, TSH.     Testing/Procedures:   [facility] [address] [place], [place] [address] Please go to the [facility] and check-in with the desk attendant.   Magnetic resonance imaging (MRI) is a painless test that produces images of the inside of the body without using Xrays.  During an MRI, strong magnets and radio waves work together in a Data processing manager to form detailed images.   MRI images may provide more details about a medical condition than X-rays, CT scans, and ultrasounds can provide.  You may be given earphones to listen for instructions.  You may eat a light breakfast and take medications as ordered with the exception of furosemide, hydrochlorothiazide, chlorthalidone or spironolactone (or any other fluid pill). If you are undergoing a stress MRI, please avoid stimulants for 12 hr prior to test. (I.e. Caffeine, nicotine, chocolate, or antihistamine medications)  If your provider has ordered anti-anxiety medications for this test, then you will need a driver.  An IV will be inserted into one of your veins. Contrast material will be injected into your IV. It will leave your body through your urine within a day. You may be told to drink plenty of fluids to help flush the contrast material out of your system.  You will be asked to remove all metal, including: Watch, jewelry, and other metal objects including hearing aids, hair pieces and dentures. Also wearable glucose monitoring systems (ie. Freestyle Libre and Omnipods) (Braces and fillings normally are not a problem.)   TEST WILL TAKE APPROXIMATELY 1 HOUR  PLEASE NOTIFY SCHEDULING  AT LEAST 24 HOURS IN ADVANCE IF YOU ARE UNABLE TO KEEP YOUR APPOINTMENT. [phone]  For more information and frequently asked questions, please visit our website : [email or web address]  Please call the Cardiac Imaging Nurse Navigators with any questions/concerns. [phone] Office    Follow-Up: At [facility], you and your health needs are our priority.  As part of our continuing mission to provide you with exceptional heart care, our providers are all part of one team.  This team includes your primary Cardiologist (physician) and Advanced Practice Providers or APPs (Physician Assistants and Nurse Practitioners) who all work together to provide you with the care you need, when you need it.  Your next appointment:   6 month(s)  Provider:   Mikey Fishman, PA-C

## 2024-10-08 NOTE — Progress Notes (Signed)
 Cardiology Office Note   Date:  10/08/2024  ID:  Donna Prince, DOB February 08, 1961, MRN [id number] PCP: Janace Stephens RAMAN, MD  [facility] Providers Cardiologist:  None     History of Present Illness Donna Prince is a 63 y.o. female with a hx of CAD status post DES to OM2 in 2020, OSA, hypertension, and hyperlipidemia who is being seen for hospital follow-up of CAD.   Patient was previous seen by [facility] cardiology.    January 2020 patient presented with chest pain found to have STEMI.  Cath showed 99% mid OM 2 lesion treated with DES.  Echo showed normal LV function.  She has allergy to lisinopril with angioedema.  She tolerates losartan.  She was started on DAPT with aspirin  and prasugrel.   In 06/13/2020 the patient presented with acute chest pressure and dyspnea in the setting of being off her DAPT for 2 weeks.  She was admitted for non-STEMI/scad.  PCP has switched prasugrel to Plavix.  Unfortunately, she had stopped her prasugrel and not taking her Plavix.  Troponin peaked at 9.8.  She underwent left heart cath on 628 that showed very tortuous coronary arteries, ostial 70% stenosis of small first diagonal, TIMI-3 flow in the previously stented marginal,, concerns for possible scad of a small ramus branch.  Medical management was recommended. Echo showed LVEF>55%. She was discharged on aspirin , Plavix and home metoprolol .    Patient presented to [facility] ED 10/20/2022 with chest pain.  Her blood pressure was 180/107, pulse rate 107 bpm.  High-sensitivity troponin was 31 and 574.  CT negative for PE.  EKG showed sinus tach.  She was given aspirin , nitroglycerin , started on IV heparin .  She reported she had not taken her cardiac medications in months.  Echo showed LVEF 60 to 65%, no wall motion abnormalities, grade 1 diastolic dysfunction, mildly reduced RV function, moderately dilated by atrium, mild MR, mild to moderate TR.  Cardiac cath showed patent LM stent with mild nonobstructive  CAD.  Suspect elevated troponin due to supply demand mismatch in the setting of uncontrolled hypertension.  Patient was re-started on Coreg , amlodipine , and hydralazine .  The patient was last seen 09/2023 and was overall doing well.   Today, the patient reports accidental weight loss. She reports appetite has been the same. She has not had cancer screening, recommended follow-up with PCP for this. She denies chest pain, SOB, lower leg edema. She has asthma and allergies. She denies palpitations, lightheadedness, dizziness, LOC. She is walking for exercise. She tries to eat healthy. She does not use her CPAP bc she does not tolerate it.   Studies Reviewed EKG Interpretation Date/Time:  Wednesday October 08 2024 14:23:23 EDT Ventricular Rate:  93 PR Interval:  162 QRS Duration:  82 QT Interval:  388 QTC Calculation: 482 R Axis:   34  Text Interpretation: Normal sinus rhythm Normal ECG When compared with ECG of 12-Oct-2023 15:28, No significant change was found Confirmed by Franchester, Dajahnae Vondra ([id number]) on 10/08/2024 2:39:21 PM    Cardiac cath 09/2022       Mid Cx lesion is 20% stenosed.   Previously placed 1st Mrg stent of unknown type is  widely patent.   The left ventricular systolic function is normal.   LV end diastolic pressure is mildly elevated.   The left ventricular ejection fraction is 55-65% by visual estimate.   1.  Widely patent OM stent with no significant restenosis.  Tortuous coronary arteries with mild nonobstructive coronary artery disease.  2.  Normal LV systolic function.  Mildly elevated left ventricular end-diastolic pressure.   Recommendations: Elevated troponin is likely due to supply demand mismatch in the setting of uncontrolled hypertension. Recommend continuing medical therapy.   Coronary Diagrams   Diagnostic Dominance: Right  Intervention     Echo 10/21/22 1. Left ventricular ejection fraction, by estimation, is 60 to 65%. The  left ventricle has normal  function. The left ventricle has no regional  wall motion abnormalities. The left ventricular internal cavity size was  mildly dilated. There is severe  concentric left ventricular hypertrophy. Left ventricular diastolic  parameters are consistent with Grade I diastolic dysfunction (impaired  relaxation).   2. Right ventricular systolic function is mildly reduced. The right  ventricular size is mildly enlarged. Mildly increased right ventricular  wall thickness. There is normal pulmonary artery systolic pressure.   3. Left atrial size was moderately dilated.   4. Right atrial size was moderately dilated.   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 Mild mitral valve regurgitation.   6. Tricuspid valve regurgitation is mild to moderate.   7. The aortic valve is calcified. Aortic valve regurgitation is trivial.  Aortic valve sclerosis/calcification is present, without any evidence of  aortic stenosis.  Physical Exam VS:  BP (!) 100/50 (BP Location: Left Arm, Patient Position: Sitting)   Pulse 93   Wt 139 lb 6.4 oz (63.2 kg)   SpO2 98%   BMI 22.50 kg/m        Wt Readings from Last 3 Encounters:  10/08/24 139 lb 6.4 oz (63.2 kg)  03/12/24 168 lb (76.2 kg)  12/10/23 186 lb 9.6 oz (84.6 kg)    GEN: Well nourished, well developed in no acute distress NECK: No JVD; No carotid bruits CARDIAC: RRR, no murmurs, rubs, gallops RESPIRATORY:  Clear to auscultation without rales, wheezing or rhonchi  ABDOMEN: Soft, non-tender, non-distended EXTREMITIES:  No edema; No deformity   ASSESSMENT AND PLAN  CAD s/p DES OM 2020 The patient denies anginal symptoms. She has unintentionally lost weight, recommended she follow-up with PCP for this. No ischemic work-up indicated at this time. I will check general labs: TSH, lipids, CMET and CBC. Continue ASA, Coreg , Lipitor , SL NTG.  Diastolic dysfunction The patient is euvolemic on exam. She is not on a diuretic at baseline. Continue Coreg .   HTN BP is low  today, likely from weight loss. I will stop amlodipine . Continue Coreg  and Hydralazine .  OSA She is not using her CPAP machine. She feels she does not have sleep apnea. The patient is not interested in re-trial of CPAP or seeing pulmonology.   Severe LVH Seen on echo in 2023. No murmur on exam. I will order a cMRI.   Abnormal TSH Last year tSH was low, recommend follow-up with PCP.      Dispo: Follow-up in 6 months  Signed, Binh Doten VEAR Fishman, PA-C

## 2024-10-09 ENCOUNTER — Ambulatory Visit: Payer: Self-pay | Admitting: Medical

## 2024-10-09 LAB — CBC
Hematocrit: 27.4 % — ABNORMAL LOW (ref 34.0–46.6)
Hemoglobin: 8.6 g/dL — ABNORMAL LOW (ref 11.1–15.9)
MCH: 23.8 pg — ABNORMAL LOW (ref 26.6–33.0)
MCHC: 31.4 g/dL — ABNORMAL LOW (ref 31.5–35.7)
MCV: 76 fL — ABNORMAL LOW (ref 79–97)
Platelets: 228 x10E3/uL (ref 150–450)
RBC: 3.62 x10E6/uL — ABNORMAL LOW (ref 3.77–5.28)
RDW: 15.7 % — ABNORMAL HIGH (ref 11.7–15.4)
WBC: 2.4 x10E3/uL — CL (ref 3.4–10.8)

## 2024-10-09 LAB — COMPREHENSIVE METABOLIC PANEL WITH GFR
ALT: 26 IU/L (ref 0–32)
AST: 33 IU/L (ref 0–40)
Albumin: 3 g/dL — ABNORMAL LOW (ref 3.9–4.9)
Alkaline Phosphatase: 88 IU/L (ref 49–135)
BUN/Creatinine Ratio: 27 (ref 12–28)
BUN: 13 mg/dL (ref 8–27)
Bilirubin Total: 0.3 mg/dL (ref 0.0–1.2)
CO2: 22 mmol/L (ref 20–29)
Calcium: 8.1 mg/dL — ABNORMAL LOW (ref 8.7–10.3)
Chloride: 104 mmol/L (ref 96–106)
Creatinine, Ser: 0.49 mg/dL — ABNORMAL LOW (ref 0.57–1.00)
Globulin, Total: 3.3 g/dL (ref 1.5–4.5)
Glucose: 101 mg/dL — ABNORMAL HIGH (ref 70–99)
Potassium: 3.6 mmol/L (ref 3.5–5.2)
Sodium: 139 mmol/L (ref 134–144)
Total Protein: 6.3 g/dL (ref 6.0–8.5)
eGFR: 107 mL/min/1.73 (ref >59)

## 2024-10-09 LAB — LIPID PANEL
Chol/HDL Ratio: 3.1 ratio (ref 0.0–4.4)
Cholesterol, Total: 83 mg/dL — ABNORMAL LOW (ref 100–199)
HDL: 27 mg/dL — ABNORMAL LOW (ref >39)
LDL Chol Calc (NIH): 40 mg/dL (ref 0–99)
Triglycerides: 74 mg/dL (ref 0–149)
VLDL Cholesterol Cal: 16 mg/dL (ref 5–40)

## 2024-10-09 LAB — TSH: TSH: <0.005 u[IU]/mL — ABNORMAL LOW (ref 0.450–4.500)

## 2024-11-10 ENCOUNTER — Encounter

## 2024-11-18 ENCOUNTER — Ambulatory Visit: Admitting: Student in an Organized Health Care Education/Training Program

## 2024-11-21 ENCOUNTER — Other Ambulatory Visit: Payer: Self-pay | Admitting: Medical

## 2024-11-21 DIAGNOSIS — I5032 Chronic diastolic (congestive) heart failure: Secondary | ICD-10-CM

## 2024-11-21 DIAGNOSIS — I10 Essential (primary) hypertension: Secondary | ICD-10-CM

## 2024-11-21 DIAGNOSIS — I2510 Atherosclerotic heart disease of native coronary artery without angina pectoris: Secondary | ICD-10-CM

## 2024-11-21 DIAGNOSIS — E782 Mixed hyperlipidemia: Secondary | ICD-10-CM

## 2024-11-27 NOTE — Addendum Note (Signed)
 Addended by: FRANCHESTER MIKEY DEL on: 11/27/2024 08:00 AM   Modules accepted: Orders

## 2024-11-28 ENCOUNTER — Telehealth: Payer: Self-pay

## 2024-11-28 NOTE — Telephone Encounter (Signed)
 Called pt to inform of the below recommendations. Left message to call back.   Furth, Cadence H, PA-C  Desiderio Russell SAILOR, RN Since cMRI is not covered, can we get an echo first, for severe LVH

## 2024-12-02 NOTE — Telephone Encounter (Signed)
 Left a message for the patient to call back.

## 2024-12-28 ENCOUNTER — Other Ambulatory Visit: Payer: Self-pay | Admitting: Cardiovascular Disease
